# Patient Record
Sex: Male | Born: 1950 | Race: White | Hispanic: No | Marital: Married | Smoking: Current every day smoker
Health system: Southern US, Community
[De-identification: ages and names within clinical notes are randomized; demographics above are authoritative.]

## PROBLEM LIST (undated history)

## (undated) DIAGNOSIS — R634 Abnormal weight loss: Secondary | ICD-10-CM

## (undated) DIAGNOSIS — I2510 Atherosclerotic heart disease of native coronary artery without angina pectoris: Secondary | ICD-10-CM

## (undated) DIAGNOSIS — E785 Hyperlipidemia, unspecified: Secondary | ICD-10-CM

## (undated) DIAGNOSIS — K220 Achalasia of cardia: Secondary | ICD-10-CM

## (undated) DIAGNOSIS — I219 Acute myocardial infarction, unspecified: Secondary | ICD-10-CM

## (undated) DIAGNOSIS — C799 Secondary malignant neoplasm of unspecified site: Secondary | ICD-10-CM

## (undated) DIAGNOSIS — Z720 Tobacco use: Secondary | ICD-10-CM

## (undated) DIAGNOSIS — I10 Essential (primary) hypertension: Secondary | ICD-10-CM

## (undated) DIAGNOSIS — K219 Gastro-esophageal reflux disease without esophagitis: Secondary | ICD-10-CM

## (undated) HISTORY — DX: Hyperlipidemia, unspecified: E78.5

## (undated) HISTORY — PX: CORONARY ARTERY BYPASS GRAFT: SHX141

## (undated) HISTORY — DX: Essential (primary) hypertension: I10

## (undated) HISTORY — DX: Acute myocardial infarction, unspecified: I21.9

---

## 1997-12-08 ENCOUNTER — Ambulatory Visit (HOSPITAL_COMMUNITY): Admission: RE | Admit: 1997-12-08 | Discharge: 1997-12-08 | Payer: Self-pay | Admitting: Cardiology

## 1999-12-27 ENCOUNTER — Encounter: Payer: Self-pay | Admitting: Cardiology

## 1999-12-27 ENCOUNTER — Ambulatory Visit (HOSPITAL_COMMUNITY): Admission: RE | Admit: 1999-12-27 | Discharge: 1999-12-27 | Payer: Self-pay | Admitting: Cardiology

## 2001-04-11 DIAGNOSIS — I219 Acute myocardial infarction, unspecified: Secondary | ICD-10-CM

## 2001-04-11 HISTORY — DX: Acute myocardial infarction, unspecified: I21.9

## 2001-12-13 ENCOUNTER — Encounter: Payer: Self-pay | Admitting: Emergency Medicine

## 2001-12-13 ENCOUNTER — Inpatient Hospital Stay (HOSPITAL_COMMUNITY): Admission: EM | Admit: 2001-12-13 | Discharge: 2001-12-17 | Payer: Self-pay | Admitting: Emergency Medicine

## 2001-12-14 ENCOUNTER — Encounter: Payer: Self-pay | Admitting: Cardiology

## 2002-03-15 ENCOUNTER — Encounter: Payer: Self-pay | Admitting: Cardiothoracic Surgery

## 2002-03-15 ENCOUNTER — Encounter: Admission: RE | Admit: 2002-03-15 | Discharge: 2002-03-15 | Payer: Self-pay | Admitting: Cardiothoracic Surgery

## 2002-03-19 ENCOUNTER — Inpatient Hospital Stay (HOSPITAL_COMMUNITY): Admission: RE | Admit: 2002-03-19 | Discharge: 2002-03-23 | Payer: Self-pay | Admitting: Cardiothoracic Surgery

## 2002-03-19 ENCOUNTER — Encounter: Payer: Self-pay | Admitting: Cardiothoracic Surgery

## 2002-03-20 ENCOUNTER — Encounter: Payer: Self-pay | Admitting: Cardiothoracic Surgery

## 2002-03-21 ENCOUNTER — Encounter: Payer: Self-pay | Admitting: Cardiothoracic Surgery

## 2002-03-22 ENCOUNTER — Encounter: Payer: Self-pay | Admitting: Cardiothoracic Surgery

## 2002-04-12 ENCOUNTER — Encounter: Payer: Self-pay | Admitting: Cardiothoracic Surgery

## 2002-04-12 ENCOUNTER — Encounter: Admission: RE | Admit: 2002-04-12 | Discharge: 2002-04-12 | Payer: Self-pay | Admitting: Cardiothoracic Surgery

## 2005-08-10 ENCOUNTER — Encounter: Payer: Self-pay | Admitting: Cardiology

## 2010-04-11 DIAGNOSIS — E785 Hyperlipidemia, unspecified: Secondary | ICD-10-CM

## 2010-04-11 DIAGNOSIS — I10 Essential (primary) hypertension: Secondary | ICD-10-CM

## 2010-04-11 HISTORY — DX: Hyperlipidemia, unspecified: E78.5

## 2010-04-11 HISTORY — DX: Essential (primary) hypertension: I10

## 2010-06-11 ENCOUNTER — Emergency Department (HOSPITAL_COMMUNITY)
Admission: EM | Admit: 2010-06-11 | Discharge: 2010-06-11 | Discharge status: Against Medical Advice | Payer: Self-pay | Attending: Emergency Medicine | Admitting: Emergency Medicine

## 2010-06-11 DIAGNOSIS — I10 Essential (primary) hypertension: Secondary | ICD-10-CM | POA: Insufficient documentation

## 2010-06-11 DIAGNOSIS — R110 Nausea: Secondary | ICD-10-CM | POA: Insufficient documentation

## 2010-06-11 DIAGNOSIS — I252 Old myocardial infarction: Secondary | ICD-10-CM | POA: Insufficient documentation

## 2010-06-11 DIAGNOSIS — R197 Diarrhea, unspecified: Secondary | ICD-10-CM | POA: Insufficient documentation

## 2010-06-11 DIAGNOSIS — Z951 Presence of aortocoronary bypass graft: Secondary | ICD-10-CM | POA: Insufficient documentation

## 2010-06-11 DIAGNOSIS — F411 Generalized anxiety disorder: Secondary | ICD-10-CM | POA: Insufficient documentation

## 2010-06-11 DIAGNOSIS — I2510 Atherosclerotic heart disease of native coronary artery without angina pectoris: Secondary | ICD-10-CM | POA: Insufficient documentation

## 2010-06-11 LAB — BASIC METABOLIC PANEL
BUN: 14 mg/dL (ref 6–23)
CO2: 19 mEq/L (ref 19–32)
Calcium: 9.5 mg/dL (ref 8.4–10.5)
Chloride: 109 mEq/L (ref 96–112)
Creatinine, Ser: 1.12 mg/dL (ref 0.4–1.5)
GFR calc Af Amer: >60 mL/min (ref >60)
GFR calc non Af Amer: >60 mL/min (ref >60)
Glucose, Bld: 104 mg/dL — ABNORMAL HIGH (ref 70–99)
Potassium: 3.9 mEq/L (ref 3.5–5.1)
Sodium: 137 mEq/L (ref 135–145)

## 2010-06-11 LAB — CBC
HCT: 44.7 % (ref 39.0–52.0)
Hemoglobin: 16 g/dL (ref 13.0–17.0)
MCH: 31.9 pg (ref 26.0–34.0)
MCHC: 35.8 g/dL (ref 30.0–36.0)
MCV: 89 fL (ref 78.0–100.0)
Platelets: 218 10*3/uL (ref 150–400)
RBC: 5.02 MIL/uL (ref 4.22–5.81)
RDW: 13.9 % (ref 11.5–15.5)
WBC: 10.4 10*3/uL (ref 4.0–10.5)

## 2010-06-11 LAB — DIFFERENTIAL
Basophils Absolute: 0.1 10*3/uL (ref 0.0–0.1)
Basophils Relative: 1 % (ref 0–1)
Eosinophils Absolute: 0.1 10*3/uL (ref 0.0–0.7)
Eosinophils Relative: 1 % (ref 0–5)
Lymphocytes Relative: 26 % (ref 12–46)
Lymphs Abs: 2.7 10*3/uL (ref 0.7–4.0)
Monocytes Absolute: 1.2 10*3/uL — ABNORMAL HIGH (ref 0.1–1.0)
Monocytes Relative: 12 % (ref 3–12)
Neutro Abs: 6.3 10*3/uL (ref 1.7–7.7)
Neutrophils Relative %: 61 % (ref 43–77)

## 2012-02-28 ENCOUNTER — Encounter (HOSPITAL_COMMUNITY)
Admission: RE | Disposition: A | Discharge status: Home / Self Care | Payer: Self-pay | Source: Ambulatory Visit | Attending: Cardiology

## 2012-02-28 ENCOUNTER — Ambulatory Visit (HOSPITAL_COMMUNITY)
Admission: RE | Admit: 2012-02-28 | Discharge: 2012-02-28 | Disposition: A | Discharge status: Home / Self Care | Payer: PRIVATE HEALTH INSURANCE | Source: Ambulatory Visit | Attending: Cardiology | Admitting: Cardiology

## 2012-02-28 DIAGNOSIS — F17200 Nicotine dependence, unspecified, uncomplicated: Secondary | ICD-10-CM | POA: Insufficient documentation

## 2012-02-28 DIAGNOSIS — I252 Old myocardial infarction: Secondary | ICD-10-CM | POA: Insufficient documentation

## 2012-02-28 DIAGNOSIS — I2510 Atherosclerotic heart disease of native coronary artery without angina pectoris: Secondary | ICD-10-CM | POA: Insufficient documentation

## 2012-02-28 DIAGNOSIS — I10 Essential (primary) hypertension: Secondary | ICD-10-CM | POA: Insufficient documentation

## 2012-02-28 DIAGNOSIS — E7800 Pure hypercholesterolemia, unspecified: Secondary | ICD-10-CM | POA: Insufficient documentation

## 2012-02-28 DIAGNOSIS — I25810 Atherosclerosis of coronary artery bypass graft(s) without angina pectoris: Secondary | ICD-10-CM | POA: Insufficient documentation

## 2012-02-28 DIAGNOSIS — F329 Major depressive disorder, single episode, unspecified: Secondary | ICD-10-CM | POA: Insufficient documentation

## 2012-02-28 DIAGNOSIS — R7309 Other abnormal glucose: Secondary | ICD-10-CM | POA: Insufficient documentation

## 2012-02-28 DIAGNOSIS — F3289 Other specified depressive episodes: Secondary | ICD-10-CM | POA: Insufficient documentation

## 2012-02-28 HISTORY — PX: LEFT HEART CATHETERIZATION WITH CORONARY ANGIOGRAM: SHX5451

## 2012-02-28 SURGERY — LEFT HEART CATHETERIZATION WITH CORONARY ANGIOGRAM
Anesthesia: LOCAL

## 2012-02-28 MED ORDER — ONDANSETRON HCL 4 MG/2ML IJ SOLN: 4.0000 mg | Freq: Four times a day (QID) | INTRAMUSCULAR | Status: DC | PRN

## 2012-02-28 MED ORDER — ASPIRIN 81 MG PO CHEW
CHEWABLE_TABLET | ORAL | Status: AC
  Filled 2012-02-28: qty 4

## 2012-02-28 MED ORDER — SODIUM CHLORIDE 0.9 % IV SOLN: INTRAVENOUS | Status: DC

## 2012-02-28 MED ORDER — ACETAMINOPHEN 325 MG PO TABS: 650.0000 mg | ORAL_TABLET | ORAL | Status: DC | PRN

## 2012-02-28 MED ORDER — NITROGLYCERIN 0.2 MG/ML ON CALL CATH LAB
INTRAVENOUS | Status: AC
  Filled 2012-02-28: qty 1

## 2012-02-28 MED ORDER — SODIUM CHLORIDE 0.9 % IJ SOLN: 3.0000 mL | INTRAMUSCULAR | Status: DC | PRN

## 2012-02-28 MED ORDER — SODIUM CHLORIDE 0.9 % IV SOLN
INTRAVENOUS | Status: DC
  Administered 2012-02-28: 1000 mL via INTRAVENOUS

## 2012-02-28 MED ORDER — MIDAZOLAM HCL 2 MG/2ML IJ SOLN
INTRAMUSCULAR | Status: AC
  Filled 2012-02-28: qty 2

## 2012-02-28 MED ORDER — FENTANYL CITRATE 0.05 MG/ML IJ SOLN
INTRAMUSCULAR | Status: AC
  Filled 2012-02-28: qty 2

## 2012-02-28 MED ORDER — LIDOCAINE HCL (PF) 1 % IJ SOLN
INTRAMUSCULAR | Status: AC
  Filled 2012-02-28: qty 30

## 2012-02-28 MED ORDER — ASPIRIN 81 MG PO CHEW
324.0000 mg | CHEWABLE_TABLET | ORAL | Status: AC
  Administered 2012-02-28: 324 mg via ORAL

## 2012-02-28 MED ORDER — SODIUM CHLORIDE 0.9 % IV SOLN: 250.0000 mL | INTRAVENOUS | Status: DC | PRN

## 2012-02-28 MED ORDER — SODIUM CHLORIDE 0.9 % IJ SOLN: 3.0000 mL | Freq: Two times a day (BID) | INTRAMUSCULAR | Status: DC

## 2012-02-28 MED ORDER — OXYCODONE-ACETAMINOPHEN 5-325 MG PO TABS: 1.0000 | ORAL_TABLET | ORAL | Status: DC | PRN

## 2012-02-28 MED ORDER — HEPARIN (PORCINE) IN NACL 2-0.9 UNIT/ML-% IJ SOLN
INTRAMUSCULAR | Status: AC
  Filled 2012-02-28: qty 1000

## 2012-02-28 NOTE — Progress Notes (Signed)
UP AND WALKED AND TOL WELL RIGHT GROIN NO BLEEDING OR HEMATOMA 

## 2012-02-28 NOTE — H&P (Signed)
  Handwritten H&P in the chart needs to be scanned. 

## 2012-02-28 NOTE — Progress Notes (Signed)
DR HARWANI CALLED AND IV FLUIDS MAY BE D/CED AFTER 4HRS

## 2012-02-28 NOTE — CV Procedure (Signed)
Left cardiac cath report dictated on 02/28/2012 dictation number is [id number]

## 2012-02-29 NOTE — Cardiovascular Report (Signed)
NAMEFRANKY, REIER NO.:  [id number]  MEDICAL RECORD NO.:  [id number]  LOCATION:  [facility]                         FACILITY:  [facility]  PHYSICIAN:  Eduardo Osier. Sharyn Lull, M.D. DATE OF BIRTH:  [birth year]  DATE OF PROCEDURE:  02/28/2012 DATE OF DISCHARGE:  02/28/2012                           CARDIAC CATHETERIZATION   PROCEDURE:  Left cardiac cath with selective left and right coronary angiography, visualization of saphenous vein graft, LIMA graft, LV graphy via right groin using Judkins technique.  INDICATION FOR THE PROCEDURE:  Mr. Mukai is 61 year old male with past medical history significant for coronary artery disease, history of MI x2, status post CABG in the past.  He had LIMA to LAD, saphenous vein graft to diagonal 1, sequential saphenous vein graft to ramus and distal left circumflex and also saphenous vein graft to RCA, hypertension, hypercholesteremia, tobacco abuse, complains of retrosternal chest pain, squeezing in nature, grade 6/10 associated with feeling weak and tired and no energy.  Denies any nausea, vomiting, diaphoresis.  Denies palpitation, lightheadedness, or syncope.  Denies PND, orthopnea, or leg swelling.  Also complains of exertional dyspnea with minimal exertion. The patient continues to smoke 1 pack per day.  States chest pain feels similar in nature when he had MI.  PAST MEDICAL HISTORY:  As above.  PAST SURGICAL HISTORY:  He had CABG as above and had hemorrhoidectomy in the past.  SOCIAL HISTORY:  He is married.  Smoked 2 packs per day for 20+ years and now 1 pack per day.  No history of alcohol abuse.  FAMILY HISTORY:  Positive for coronary artery disease.  MEDICATIONS AT HOME:  He was on aspirin, Toprol, simvastatin, Paxil, Xanax and Plavix and Imdur were recently added.  PHYSICAL EXAMINATION:  GENERAL:  He was alert, awake, oriented x3, in no acute distress. VITAL SIGNS:  Blood pressure was 130/80, pulse was 68 and  regular. EYES:  Conjunctivae was pink. NECK:  Supple.  No JVD.  No bruit. LUNGS:  Clear to auscultation without rhonchi or rales. CARDIOVASCULAR:  S1, S2 was normal.  There was soft systolic murmur. ABDOMEN:  Soft.  Bowel sounds were present.  Nontender. EXTREMITIES:  There is no clubbing, cyanosis, or edema.  IMPRESSION:  Unstable angina, rule out myocardial infarction, coronary artery disease, history of myocardial infarction x2 in the past, status post coronary artery bypass graft, hypertension, glucose intolerance, hypercholesteremia, depression, tobacco abuse.  Discussed with the patient regarding various options of treatment, i.e., noninvasive stress testing versus left cath, possible PTCA stenting, its risks and benefits, i.e., death, MI, stroke, need for emergency CABG, local vascular complications, etc., and consented for PCI.  PROCEDURE:  After obtaining the informed consent, the patient was brought to the cath lab and was placed on fluoroscopy table.  Right groin was prepped and draped in usual fashion.  Xylocaine 1% was used for local anesthesia in the right groin.  With the help of thin wall needle, 6-French arterial sheath was placed.  The sheath was aspirated and flushed.  Next 6-French left Judkins catheter was advanced over the wire under fluoroscopic guidance up to the ascending aorta.  Wire was pulled out.  The catheter was aspirated and connected  to the Manifold. Catheter was further advanced and engaged into left coronary ostium. Multiple views of the left system were taken.  Next, the catheter was disengaged and was pulled out over the wire and was replaced with 6- [place] right Judkins catheter, which was advanced over the wire under fluoroscopic guidance up to the ascending aorta.  Wire was pulled out. The catheter was aspirated and connected to the Manifold.  Catheter was further advanced and engaged into saphenous vein graft to RCA.  Multiple views of this  graft were taken.  Next catheter was disengaged and was engaged into saphenous vein graft to sequential saphenous vein graft to ramus and distal circumflex.  Multiple views of this graft were taken. This catheter was disengaged and was engaged into saphenous vein graft to diagonal 1.  Multiple views of this graft were taken.  Next, the catheter was disengaged and was advanced under fluoroscopic guidance over the wire into left subclavian artery.  This catheter was exchanged over the wire with IM diagnostic catheter which was advanced over the wire under fluoroscopic guidance up to the subclavian artery.  This catheter was then subsequently engaged into LIMA to LAD.  Multiple views of this graft were taken.  Next, the catheter was disengaged and was pulled out over the wire and was replaced with 6-French pigtail catheter, which was advanced over the wire under fluoroscopic guidance up to the ascending aorta.  Catheter was further advanced across the aortic valve into the LV.  LV pressures were recorded.  Next LV graft was done in 30-degree RAO position.  Post angiographic pressures were recorded from LV and then pullback pressures were recorded from aorta. There was no gradient across the aortic valve.  Next the pigtail catheter was pulled out over the wire.  Sheaths were aspirated and flushed.  FINDINGS:  LV showed good LV systolic function, mild LVH, EF of approximately 50%.  Left main had 80-85% distal stenosis.  LAD was 100% occluded beyond proximal portion.  Diagonal 1 was very small.  Ramus has 60-70% proximal stenosis which was filling distally by saphenous vein graft.  Left circumflex has 60-80% sequential ostial and proximal stenosis and then was 100% occluded distally.  OM 1 was very small which was patent.  Native RCA was 100% occluded at the ostium from prior angiogram.  Saphenous vein graft to PDA has a wedge-shaped filling defect in the mid body of the graft with TIMI grade  3 distal flow.  This appears to be the valve.  Native RCA was filling distally from saphenous vein graft, which has 90% distal stenosis.  PLV branches were diffusely diseased.  Sequential saphenous vein graft to ramus and distal left circumflex, #1 distal left circumflex portion beyond the graft has 85- 95% stenosis and the distal left circumflex beyond the anastomosis vessel is very small.  Ramus portion of the saphenous vein graft branch beyond the anastomosis is patent.  Saphenous vein graft to diagonal 1 has 30-40% anastomotic stenosis with haziness but TIMI grade 3 distal flow.  LIMA to LAD is patent.  Native LAD distally is diffusely diseased.  The patient tolerated the procedure well.  There were no complications.  Plan is to maximize antianginal medications and treat medically.     Eduardo Osier. Sharyn Lull, M.D.     MNH/MEDQ  D:  02/28/2012  T:  02/29/2012  Job:  [id number]

## 2012-05-23 ENCOUNTER — Encounter (HOSPITAL_COMMUNITY): Payer: Self-pay | Admitting: Pharmacy Technician

## 2012-05-29 ENCOUNTER — Ambulatory Visit (HOSPITAL_COMMUNITY): Admission: RE | Admit: 2012-05-29 | Payer: PRIVATE HEALTH INSURANCE | Source: Ambulatory Visit | Admitting: Cardiology

## 2012-05-29 ENCOUNTER — Encounter (HOSPITAL_COMMUNITY): Admission: RE | Payer: Self-pay | Source: Ambulatory Visit

## 2012-05-29 SURGERY — PERCUTANEOUS CORONARY STENT INTERVENTION (PCI-S)
Anesthesia: LOCAL

## 2012-06-05 ENCOUNTER — Encounter (HOSPITAL_COMMUNITY)
Admission: RE | Discharge status: Against Medical Advice | Payer: Self-pay | Source: Ambulatory Visit | Attending: Cardiology

## 2012-06-05 ENCOUNTER — Ambulatory Visit (HOSPITAL_COMMUNITY)
Admission: RE | Admit: 2012-06-05 | Discharge: 2012-06-06 | Discharge status: Against Medical Advice | Payer: PRIVATE HEALTH INSURANCE | Source: Ambulatory Visit | Attending: Cardiology | Admitting: Cardiology

## 2012-06-05 DIAGNOSIS — I2582 Chronic total occlusion of coronary artery: Secondary | ICD-10-CM | POA: Insufficient documentation

## 2012-06-05 DIAGNOSIS — I200 Unstable angina: Secondary | ICD-10-CM | POA: Insufficient documentation

## 2012-06-05 DIAGNOSIS — E7800 Pure hypercholesterolemia, unspecified: Secondary | ICD-10-CM | POA: Insufficient documentation

## 2012-06-05 DIAGNOSIS — R7309 Other abnormal glucose: Secondary | ICD-10-CM | POA: Insufficient documentation

## 2012-06-05 DIAGNOSIS — Z8249 Family history of ischemic heart disease and other diseases of the circulatory system: Secondary | ICD-10-CM | POA: Insufficient documentation

## 2012-06-05 DIAGNOSIS — I25810 Atherosclerosis of coronary artery bypass graft(s) without angina pectoris: Secondary | ICD-10-CM | POA: Insufficient documentation

## 2012-06-05 DIAGNOSIS — I2510 Atherosclerotic heart disease of native coronary artery without angina pectoris: Secondary | ICD-10-CM | POA: Insufficient documentation

## 2012-06-05 DIAGNOSIS — I10 Essential (primary) hypertension: Secondary | ICD-10-CM | POA: Insufficient documentation

## 2012-06-05 DIAGNOSIS — I252 Old myocardial infarction: Secondary | ICD-10-CM | POA: Insufficient documentation

## 2012-06-05 DIAGNOSIS — F17200 Nicotine dependence, unspecified, uncomplicated: Secondary | ICD-10-CM | POA: Insufficient documentation

## 2012-06-05 HISTORY — PX: PERCUTANEOUS CORONARY STENT INTERVENTION (PCI-S): SHX5485

## 2012-06-05 SURGERY — PERCUTANEOUS CORONARY STENT INTERVENTION (PCI-S)
Anesthesia: LOCAL

## 2012-06-05 MED ORDER — ALPRAZOLAM 0.25 MG PO TABS: 0.2500 mg | ORAL_TABLET | Freq: Three times a day (TID) | ORAL | Status: DC | PRN

## 2012-06-05 MED ORDER — ASPIRIN 81 MG PO CHEW
324.0000 mg | CHEWABLE_TABLET | ORAL | Status: AC
  Administered 2012-06-05: 324 mg via ORAL

## 2012-06-05 MED ORDER — SODIUM CHLORIDE 0.9 % IJ SOLN: 3.0000 mL | Freq: Two times a day (BID) | INTRAMUSCULAR | Status: DC

## 2012-06-05 MED ORDER — MIDAZOLAM HCL 2 MG/2ML IJ SOLN: 2.0000 mg | Freq: Once | INTRAMUSCULAR | Status: DC

## 2012-06-05 MED ORDER — MIDAZOLAM HCL 2 MG/2ML IJ SOLN
INTRAMUSCULAR | Status: AC
  Filled 2012-06-05: qty 2

## 2012-06-05 MED ORDER — NITROGLYCERIN IN D5W 200-5 MCG/ML-% IV SOLN
5.0000 ug/min | INTRAVENOUS | Status: DC
  Filled 2012-06-05: qty 250

## 2012-06-05 MED ORDER — SODIUM CHLORIDE 0.9 % IJ SOLN: 3.0000 mL | INTRAMUSCULAR | Status: DC | PRN

## 2012-06-05 MED ORDER — METOPROLOL SUCCINATE ER 50 MG PO TB24
50.0000 mg | ORAL_TABLET | Freq: Every day | ORAL | Status: DC
  Filled 2012-06-05: qty 1

## 2012-06-05 MED ORDER — ALPRAZOLAM 0.25 MG PO TABS
0.5000 mg | ORAL_TABLET | Freq: Two times a day (BID) | ORAL | Status: DC
  Administered 2012-06-05: 0.5 mg via ORAL
  Filled 2012-06-05 (×2): qty 2

## 2012-06-05 MED ORDER — NITROGLYCERIN 1 MG/10 ML FOR IR/CATH LAB
INTRA_ARTERIAL | Status: AC
  Filled 2012-06-05: qty 10

## 2012-06-05 MED ORDER — MIDAZOLAM HCL 2 MG/2ML IJ SOLN
2.0000 mg | Freq: Once | INTRAMUSCULAR | Status: AC
  Administered 2012-06-05: 2 mg via INTRAVENOUS

## 2012-06-05 MED ORDER — BIVALIRUDIN 250 MG IV SOLR
INTRAVENOUS | Status: AC
  Filled 2012-06-05: qty 250

## 2012-06-05 MED ORDER — SODIUM CHLORIDE 0.9 % IV SOLN: 250.0000 mL | INTRAVENOUS | Status: DC | PRN

## 2012-06-05 MED ORDER — SODIUM CHLORIDE 0.9 % IV SOLN
INTRAVENOUS | Status: DC
  Administered 2012-06-05: 10:00:00 via INTRAVENOUS

## 2012-06-05 MED ORDER — SODIUM CHLORIDE 0.9 % IV SOLN
0.2500 mg/kg/h | INTRAVENOUS | Status: AC
  Filled 2012-06-05: qty 250

## 2012-06-05 MED ORDER — ASPIRIN 81 MG PO TABS: 81.0000 mg | ORAL_TABLET | Freq: Every day | ORAL | Status: DC

## 2012-06-05 MED ORDER — HYDRALAZINE HCL 20 MG/ML IJ SOLN
10.0000 mg | Freq: Once | INTRAMUSCULAR | Status: AC
  Administered 2012-06-05: 10 mg via INTRAVENOUS
  Filled 2012-06-05: qty 0.5

## 2012-06-05 MED ORDER — SODIUM CHLORIDE 0.9 % IV SOLN: INTRAVENOUS | Status: AC

## 2012-06-05 MED ORDER — HEPARIN (PORCINE) IN NACL 2-0.9 UNIT/ML-% IJ SOLN
INTRAMUSCULAR | Status: AC
  Filled 2012-06-05: qty 1000

## 2012-06-05 MED ORDER — MORPHINE SULFATE 2 MG/ML IJ SOLN
2.0000 mg | INTRAMUSCULAR | Status: DC | PRN
  Administered 2012-06-05: 17:00:00 2 mg via INTRAVENOUS
  Administered 2012-06-05: 16:00:00 4 mg via INTRAVENOUS
  Filled 2012-06-05: qty 1
  Filled 2012-06-05: qty 2

## 2012-06-05 MED ORDER — MIDAZOLAM HCL 2 MG/2ML IJ SOLN
2.0000 mg | Freq: Once | INTRAMUSCULAR | Status: AC
  Administered 2012-06-05: 17:00:00 2 mg via INTRAVENOUS
  Filled 2012-06-05: qty 2

## 2012-06-05 MED ORDER — ACETAMINOPHEN 325 MG PO TABS: 650.0000 mg | ORAL_TABLET | ORAL | Status: DC | PRN

## 2012-06-05 MED ORDER — TICAGRELOR 90 MG PO TABS
90.0000 mg | ORAL_TABLET | Freq: Two times a day (BID) | ORAL | Status: DC
  Administered 2012-06-05: 90 mg via ORAL
  Filled 2012-06-05 (×3): qty 1

## 2012-06-05 MED ORDER — LIDOCAINE HCL (PF) 1 % IJ SOLN
INTRAMUSCULAR | Status: AC
  Filled 2012-06-05: qty 30

## 2012-06-05 MED ORDER — ASPIRIN 81 MG PO CHEW
CHEWABLE_TABLET | ORAL | Status: AC
  Filled 2012-06-05: qty 4

## 2012-06-05 MED ORDER — NITROGLYCERIN 0.4 MG SL SUBL: 0.4000 mg | SUBLINGUAL_TABLET | SUBLINGUAL | Status: DC | PRN

## 2012-06-05 MED ORDER — TICAGRELOR 90 MG PO TABS
ORAL_TABLET | ORAL | Status: AC
  Filled 2012-06-05: qty 2

## 2012-06-05 MED ORDER — ONDANSETRON HCL 4 MG/2ML IJ SOLN: 4.0000 mg | Freq: Four times a day (QID) | INTRAMUSCULAR | Status: DC | PRN

## 2012-06-05 MED ORDER — HYDRALAZINE HCL 20 MG/ML IJ SOLN
10.0000 mg | Freq: Once | INTRAMUSCULAR | Status: AC
  Administered 2012-06-05: 17:00:00 10 mg via INTRAVENOUS
  Filled 2012-06-05: qty 0.5

## 2012-06-05 MED ORDER — FENTANYL CITRATE 0.05 MG/ML IJ SOLN
INTRAMUSCULAR | Status: AC
  Filled 2012-06-05: qty 2

## 2012-06-05 MED ORDER — DIAZEPAM 5 MG PO TABS
ORAL_TABLET | ORAL | Status: AC
  Filled 2012-06-05: qty 1

## 2012-06-05 MED ORDER — PAROXETINE HCL 20 MG PO TABS
20.0000 mg | ORAL_TABLET | Freq: Every day | ORAL | Status: DC
  Filled 2012-06-05: qty 1

## 2012-06-05 MED ORDER — ASPIRIN 81 MG PO CHEW
81.0000 mg | CHEWABLE_TABLET | Freq: Every day | ORAL | Status: DC
  Filled 2012-06-05: qty 1

## 2012-06-05 MED ORDER — SIMVASTATIN 40 MG PO TABS
40.0000 mg | ORAL_TABLET | Freq: Every day | ORAL | Status: DC
  Filled 2012-06-05: qty 1

## 2012-06-05 MED ORDER — SODIUM CHLORIDE 0.9 % IV SOLN
0.2500 mg/kg/h | INTRAVENOUS | Status: DC
  Filled 2012-06-05: qty 250

## 2012-06-05 MED ORDER — DIAZEPAM 5 MG PO TABS
5.0000 mg | ORAL_TABLET | ORAL | Status: AC
  Administered 2012-06-05: 5 mg via ORAL

## 2012-06-05 MED ORDER — PANTOPRAZOLE SODIUM 40 MG PO TBEC
40.0000 mg | DELAYED_RELEASE_TABLET | Freq: Every day | ORAL | Status: DC
  Administered 2012-06-06: 40 mg via ORAL
  Filled 2012-06-05 (×2): qty 1

## 2012-06-05 MED ORDER — ASPIRIN 81 MG PO CHEW: 81.0000 mg | CHEWABLE_TABLET | Freq: Every day | ORAL | Status: DC

## 2012-06-05 NOTE — Progress Notes (Signed)
Site area: right groin  Site Prior to Removal:  Level 0  Pressure Applied For 20 MINUTES    Minutes Beginning at 1708  Manual:   yes  Patient Status During Pull:  stable  Post Pull Groin Site:  Level 0  Post Pull Instructions Given:  yes  Post Pull Pulses Present:  yes  Dressing Applied:  yes  Comments:  Gauze pressure dressing applied

## 2012-06-05 NOTE — Progress Notes (Signed)
Patient seems very angry about being in the hospital.  States he wants to leave and wants staff to stop f-----ing with him.  Trying to meet his needs as best I can.  Explained why it is important that he had procedure and post procedure care; but he does not seem to accept explainaition.  Will attempt to give xanax and morphine to reduce anxiety, improve mood, and mitigate pain for sheath pull.

## 2012-06-05 NOTE — Progress Notes (Signed)
Patient continues with abusive language to staff, non compliant, refusing meds, tele monitoring, BP checks, threatening to leave AMA.  Dr. Sharyn Lull and [facility] called to bedside.

## 2012-06-05 NOTE — CV Procedure (Signed)
PTCA stenting report dictated on 06/05/2012 dictation number is [id number]

## 2012-06-05 NOTE — H&P (Signed)
  Handwritten H&P in the chart needs to be scanned. 

## 2012-06-05 NOTE — Progress Notes (Signed)
Utilization Review Completed Fedor Kazmierski J. Ripley Lovecchio, RN, BSN, NCM [phone]  

## 2012-06-05 NOTE — Progress Notes (Signed)
Patient continues to send staff out of the room and does not want to be bothered. I was able to assess the groin site level zero. I explained we would try to let him rest but I still needed to monitor him. Patient made it clear that he does not want anyone come in his room. I am unable to agree however I will make visits as needed or limited.

## 2012-06-06 LAB — BASIC METABOLIC PANEL
BUN: 8 mg/dL (ref 6–23)
Calcium: 9.2 mg/dL (ref 8.4–10.5)
GFR calc non Af Amer: >90 mL/min (ref >90)
Glucose, Bld: 114 mg/dL — ABNORMAL HIGH (ref 70–99)

## 2012-06-06 LAB — CBC
HCT: 41 % (ref 39.0–52.0)
Hemoglobin: 14.1 g/dL (ref 13.0–17.0)
MCH: 30.7 pg (ref 26.0–34.0)
MCHC: 34.4 g/dL (ref 30.0–36.0)

## 2012-06-06 MED ORDER — TICAGRELOR 90 MG PO TABS: 90.0000 mg | ORAL_TABLET | Freq: Two times a day (BID) | ORAL | Status: DC

## 2012-06-06 MED FILL — Dextrose Inj 5%: INTRAVENOUS | Qty: 50 | Status: AC

## 2012-06-06 NOTE — Discharge Instructions (Signed)
Coronary Angiography with Stent This is a procedure to widen or open a narrow blood vessel of the heart (coronary artery). When a coronary artery becomes partially blocked it decreases blood flow to that area. This may lead to chest pain or a heart attack (myocardial infarction). Arteries may become blocked by cholesterol buildup (plaque) in the lining or wall. A stent is a small piece of metal that looks like a mesh or a spring. Stent placement may be done right after an angiogram that finds a blocked artery or as a treatment for a heart attack. RISKS AND COMPLICATIONS  Damage to the heart.  A blockage may return.  Bleeding at the site.  Blood clot to another part of the body. PROCEDURE  You may be given a medication to help you relax before and during the procedure through an IV in your hand or arm.  A local anesthetic to make the area numb may be used before inserting the catheter (a long, hollow tube about the size of a piece of cooked spaghetti).  You will be prepared for the procedure by washing and shaving the area where the catheter will be inserted. This is usually done in the groin.  A specially trained doctor will insert the catheter with a guide wire into an artery. This is guided under a special type of X-ray (fluoroscopy) to the opening of the blocked artery.  Special dye is then injected and X-rays are taken.  A tiny wire is guided to the blocked spot and a balloon is inflated to make the artery wider. The stent is expanded and crushes the plaque into the wall of the vessel. The stent holds the area open like a scaffolding and improves the blood flow.  Sometimes the artery may be made wider using a laser or other tools to remove plaque.  When the blood flow is better, the catheter is removed. The lining of the artery will grow over the stent which stays where it was placed. AFTER THE PROCEDURE  You will stay in bed for several hours.  The access site will be watched  and you will be checked frequently.  Blood tests, X-rays and an EKG may be done.  You may stay in the hospital overnight for observation. SEEK IMMEDIATE MEDICAL CARE IF:   You develop chest pain, shortness of breath, feel faint, or pass out.  There is bleeding, swelling, or drainage from the catheter insertion site.  You develop pain, discoloration, coldness, or severe bruising in the leg or arm that held the catheter.  You see blood in your urine or stool. This may be bright red blood in urine or stools, or also appear as black, tarry stools.  You have a fever. Document Released: 10/02/2002 Document Revised: 06/20/2011 Document Reviewed: 05/25/2007 [facility] Patient Information 2013 [place], [place]. Acute Coronary Syndrome Acute coronary syndrome (ACS) is an urgent problem in which the blood and oxygen supply to the heart is critically deficient. ACS requires hospitalization because one or more coronary arteries may be blocked. ACS represents a range of conditions including:  Previous angina that is now unstable, lasts longer, happens at rest, or is more intense.  A heart attack, with heart muscle cell injury and death. There are three vital coronary arteries that supply the heart muscle with blood and oxygen so that it can pump blood effectively. If blockages to these arteries develop, blood flow to the heart muscle is reduced. If the heart does not get enough blood, angina may occur as  the first warning sign. SYMPTOMS   The most common signs of angina include:  Tightness or squeezing in the chest.  Feeling of heaviness on the chest.  Discomfort in the arms, neck, or jaw.  Shortness of breath and nausea.  Cold, wet skin.  Angina is usually brought on by physical effort or excitement which increase the oxygen needs of the heart. These states increase the blood flow needs of the heart beyond what can be delivered. TREATMENT   Medicines to help discomfort may include  nitroglycerin (nitro) in the form of tablets or a spray for rapid relief, or longer-acting forms such as cream, patches, or capsules. (Be aware that there are many side effects and possible interactions with other drugs).  Other medicines may be used to help the heart pump better.  Procedures to open blocked arteries including angioplasty or stent placement to keep the arteries open.  Open heart surgery may be needed when there are many blockages or they are in critical locations that are best treated with surgery. HOME CARE INSTRUCTIONS   Avoid smoking.  Take one baby or adult aspirin daily, if your caregiver advises. This helps reduce the risk of a heart attack.  It is very important that you follow the angina treatment prescribed by your caregiver. Make arrangements for proper follow-up care.  Eat a heart healthy diet with salt and fat restrictions as advised.  Regular exercise is good for you as long as it does not cause discomfort. Do not begin any new type of exercise until you check with your caregiver.  If you are overweight, you should lose weight.  Try to maintain normal blood lipid levels.  Keep your blood pressure under control as recommended by your caregiver.  You should tell your caregiver right away about any increase in the severity or frequency of your chest discomfort or angina attacks. When you have angina, you should stop what you are doing and sit down. This may bring relief in 3 to 5 minutes. If your caregiver has prescribed nitro, take it as directed.  If your caregiver has given you a follow-up appointment, it is very important to keep that appointment. Not keeping the appointment could result in a chronic or permanent injury, pain, and disability. If there is any problem keeping the appointment, you must call back to this facility for assistance. SEEK IMMEDIATE MEDICAL CARE IF:   You develop nausea, vomiting, or shortness of breath.  You feel faint,  lightheaded, or pass out.  Your chest discomfort gets worse.  You are sweating or experience sudden profound fatigue.  You do not get relief of your chest pain after 3 doses of nitro.  Your discomfort lasts longer than 15 minutes. MAKE SURE YOU:   Understand these instructions.  Will watch your condition.  Will get help right away if you are not doing well or get worse. Document Released: 03/28/2005 Document Revised: 06/20/2011 Document Reviewed: 10/30/2007 [facility] Patient Information 2013 [place], [place].

## 2012-06-06 NOTE — Discharge Summary (Signed)
Erik Blake, Erik Blake NO.:  [id number]  MEDICAL RECORD NO.:  [id number]  LOCATION:  6529                         FACILITY:  [facility]  PHYSICIAN:  Eduardo Osier. Sharyn Lull, M.D. DATE OF BIRTH:  May 28, 1950  DATE OF ADMISSION:  06/05/2012 DATE OF DISCHARGE:  06/06/2012                              DISCHARGE SUMMARY   ADMITTING DIAGNOSES: 1. Unstable angina. 2. Coronary artery disease status post coronary artery bypass graft in     the past 3. History of myocardial infarction x3 in the past. 4. Hypertension. 5. Glucose intolerance. 6. Hypercholesteremia. 7. Positive family history of coronary artery disease. 8. Tobacco abuse.  DISCHARGE DIAGNOSES: 1. Stable angina status post left cath/percutaneous transluminal     coronary angioplasty stenting to protected left main and left     circumflex, coronary artery disease. 2. Status post coronary artery bypass graft. 3. History of myocardial infarction x3 in the past. 4. Hypertension. 5. Glucose intolerance. 6. Hypercholesteremia. 7. Positive family history of coronary artery disease. 8. Tobacco abuse.  DISCHARGE HOME MEDICATIONS: 1. Enteric-coated aspirin 81 mg 1 tablet daily. 2. Brilinta 90 mg 1 tablet twice daily. 3. Xanax 0.5 mg twice daily. 4. Amlodipine 5 mg daily. 5. Metoprolol succinate 50 mg daily. 6. Nitrostat 0.4 mg use as directed. 7. Paxil 20 mg 1 tablet daily. 8. Simvastatin 40 mg daily.  DIET:  Low-salt, low-cholesterol.  ACTIVITY:  Increase activity slowly as tolerated.  The patient will be discussed to join phase 2 cardiac rehab as outpatient.  Patient signed out against medical advice, post cath, PTCA stents instruction were given.  BRIEF HISTORY AND HOSPITAL COURSE:  [name] is a 62 year old white male with past medical history significant for coronary artery disease, history of MI in x 2 in 1990s, and then subsequently had non-Q-wave myocardial infarction in 2003, requiring emergency PTCA  stenting to left circumflex.  Noted to have 3-vessel disease, subsequently required CABG in December 2003, had LIMA to LAD, saphenous vein graft to diagonal, sequential saphenous vein graft to ramus and left circumflex, saphenous vein graft to RCA, hypertension, glucose intolerance, hypercholesteremia, tobacco abuse, complains of retrosternal chest pain, squeezing in nature, associated with feeling weak and tired with no energy.  Denies any nausea, vomiting, diaphoresis.  Also complains of exertional dyspnea with minimal exertion, and gives out by the end of the day.  Denies any palpitation, lightheadedness, syncope.  The patient had left cath in November 2013, which showed critical stenosis and sequential saphenous vein graft to ramus and left circumflex.  Ramus graft appears to be patent, but left circumflex portion of the graft at critical stenosis with haziness and was noted to have 60-80% ostial and proximal left circumflex stenosis prior to OM1 and also critical distal left main stenosis.  The patient initially opted for medical management, but due to recurrent chest pain, consented for high-risk PCI.  PAST MEDICAL HISTORY:  As above.  PAST SURGICAL HISTORY:  He had CABG as above, had hemorrhoidectomy in the past.  SOCIAL HISTORY:  He is married, smoked 2 packs per day for 20 plus years and now smokes less than 1 pack per day, and is in the process of quitting.  No  history of alcohol abuse.  FAMILY HISTORY:  Positive for coronary artery disease.  Home medications were aspirin, Plavix, Toprol, Imdur, simvastatin, Norvasc, Paxil, Xanax, and Nitrostat.  PHYSICAL EXAMINATION:  GENERAL:  He was alert, awake, oriented x3, in no acute distress. VITAL SIGNS:  Blood pressure was 130/80, pulse was 68. EYES:  Conjunctivae was pink. NECK:  Supple.  No JVD.  No bruit. LUNGS:  Clear to auscultation without rhonchi or rales. CARDIOVASCULAR:  S1, S2 was normal.  There was soft systolic  murmur.  No S3 gallop. ABDOMEN:  Soft.  Bowel sounds were present.  Nontender. EXTREMITIES:  There is no clubbing, cyanosis, or edema.  LABORATORY DATA:  His sodium was 139, potassium 3.8, BUN 8, creatinine 0.82, glucose was 114.  Hemoglobin was 14.1, hematocrit 41, white count of 8.2.  Postprocedure EKG, this morning showed normal sinus rhythm with poor R-wave progression and nonspecific ST-T wave changes.  BRIEF HOSPITAL COURSE:  The patient was a.m. admit and underwent left cardiac cath and PTCA stenting to protected left main and left circumflex.  The patient tolerated the procedure well.  There were no complications.  Postprocedure, the patient did not have any chest pains during the hospital stay.  The patient was very adamant to leave the hospital against medical advice yesterday, but finally agreed to stay till this morning.  The patient got up early this morning, and pulled out all his IVs and signed out against medical advice.  We will contact the patient at home and has pharmacy regarding his medications and the patient will be followed up in my office later this week.     Eduardo Osier. Sharyn Lull, M.D.     [facility]/MEDQ  D:  06/06/2012  T:  06/06/2012  Job:  [id number]

## 2012-06-06 NOTE — Progress Notes (Signed)
Patient refuses to wear monitor this morning. He wants to leave spoke with Dr.Harwani he will be here in 10 min. Patient refuses to stay he took his own IV out and  signed AMA explanation that insurance may not cover admission was also given. Wife at bedside unable to keep patient here also. [employer] coverage was also notified.

## 2012-06-06 NOTE — Discharge Summary (Signed)
  Discharge summary dictated on 06/06/2012 dictation number is [id number]

## 2012-06-06 NOTE — Cardiovascular Report (Signed)
NAMEKERRIGAN, Erik Blake NO.:  [id number]  MEDICAL RECORD NO.:  [id number]  LOCATION:  6529                         FACILITY:  [facility]  PHYSICIAN:  Eduardo Osier. Sharyn Lull, M.D. DATE OF BIRTH:  04-11-1951  DATE OF PROCEDURE:  06/05/2012 DATE OF DISCHARGE:                           CARDIAC CATHETERIZATION   PROCEDURES: 1. Left cardiac cath with selective left coronary angiography,     visualization of saphenous vein graft via right groin using Judkins     technique. 2. Successful percutaneous transluminal coronary angioplasty to     proximal and ostial left circumflex using 2.5 x 12-mm long Emerge     balloon. 3. Successful deployment of 3.0 x 18-mm long Xience expedition drug-     eluting stent in proximal and ostial left circumflex. 4. Successful postdilatation of the stent using 3.25 x 15-mm long [place]     Quantum apex balloon going up to 15 atmospheric pressure. 5. Successful percutaneous transluminal coronary angioplasty to mid     and distal left main using initially 2.5 x 12-mm long Emerge     balloon and then 3.25 x 15-mm long [place] Quantum apex balloon. 6. Successful deployment of 4.0 x 12-mm long Xience expedition drug-     eluting stent in distal and mid left main overlapping with ostial     left circumflex stent, successful postdilatation of the stent using     4.0 x 8-mm long [place] Quantum apex balloon.  INDICATION FOR THE PROCEDURE:  Erik Blake is a 62 year old white male with past medical history significant for coronary artery disease, history of MI x2 in 50s.  He then had non-Q-wave myocardial infarction in 2003, requiring emergency PCI to left circumflex.  Noted to have 3- vessel coronary artery disease, subsequently required CABG in December, 2003.  He had LIMA to LAD, saphenous vein graft to diagonal 1, sequential saphenous vein graft to ramus and obtuse marginal 1, saphenous vein graft to RCA, hypertension, glucose intolerance, hypercholesteremia, and  depression, tobacco abuse, complains of retrosternal chest pain, squeezing in nature associated with feeling weak, tired, and no energy.  Denies any nausea, vomiting, diaphoresis. Also complains of exertional dyspnea with minimal exertion, and gives out by the end of the day.  The patient denies any palpitation, lightheadedness, or syncope.  The patient had left cath in November 2013, which showed critical stenosis in sequential graft to saphenous vein graft to ramus and left circumflex.  Ramus graft appears to be patent, but the left circumflex graft had critical stenosis with haziness and thrombus.  Native left circumflex was noted to have 60-80% ostial and proximal stenosis prior to OM1.  Left main had 80-85% distal stenosis.  The patient initially opted for medical Rx, but due to recurrent chest pain, consented for high-risk PCI.  PROCEDURE:  After obtaining the informed consent, the patient was brought to the cath lab and was placed on fluoroscopy table.  Right groin was prepped and draped in usual fashion.  Xylocaine 1% was used for local anesthesia in the right groin.  With the help of thin wall needle, 6-French arterial sheath was placed.  The sheath was aspirated and flushed.  Next, 5-French left bypass diagnostic catheter  was advanced over the wire under fluoroscopic guidance up to the ascending aorta.  Wire was pulled out.  The catheter was aspirated and connected to the Manifold.  Catheter was further advanced and engaged into saphenous vein graft to diagonal 1.  The single view of this graft was obtained.  Next, the catheter was disengaged and was engaged into sequential saphenous vein graft to ramus and left circumflex.  A single view of this graft was obtained.  Next, the catheter was disengaged and was pulled out over the wire and was replaced with 6-French 3.5 [place] guiding catheter was advanced over the wire under fluoroscopic guidance up to the ascending aorta.  Wire  was pulled out.  The catheter was aspirated and connected to the Manifold.  Catheter was further advanced and engaged into left coronary ostium.  Multiple views of the left system were taken.  FINDINGS:  Left main had 80-85% distal stenosis.  LAD was 100% occluded as before.  Ramus has 40-50% proximal stenosis, which was filling saphenous vein graft.  Left circumflex has 60-85% sequential ostial and proximal stenosis prior to OM1.  OM1 has 40-50% proximal stenosis, left circumflex has 40-50% mid stenosis and then was 100% occluded.  The patient also has large left atrial branch, which was patent.  Saphenous vein graft to diagonal 1 was patent, had anastomotic stenosis 20-30% as before, but with good TIMI grade 3 distal flow.  Sequential saphenous vein graft to ramus and OM1.  Ramus portion of the graft was patent with TIMI grade 3 distal flow.  OM1 portion of the sequential graft has haziness with thrombus with TIMI 1 distal flow in the native OM1.  INTERVENTIONAL PROCEDURE:  Successful PTCA to proximal and ostial left circumflex was done using 2.5 x 12-mm long emergent balloon for predilatation and then 3.0 x 18-mm long Xience expedition drug-eluting stent was deployed at 11 atmospheric pressure.  The stent was post dilated using 3.25 x 15-mm long [place] Quantum apex balloon going up to 15 atmospheric pressure.  Lesion dilated from 60-85% to 0% residual with excellent TIMI grade 3 distal flow without evidence of dissection or distal embolization, and then PTCA to distal and mid left main was done using same 3.25 x 15-mm long [place] Quantum apex balloon for predilatation, and then 4.0 x 12-mm long Xience expedition drug-eluting stent was deployed at 11 atmospheric pressure in mid and distal left main going to overlapping with left circumflex ostial stent.  The stent was post dilated using 4.0 x 8-mm long [place] Quantum apex balloon going up to 12 atmospheric pressure in the left circumflex and  15-18 atmospheric pressure in the left main lesion dilated from 80-85% to 0% residual with excellent TIMI grade 3 distal flow without evidence of dissection or distal embolization, or any compromise of flow in ramus or LAD system. The patient received weight-based Angiomax and 180 mg of Brilinta prior to the procedure.  The patient tolerated the procedure well.  There were no complications.  The patient was transferred to recovery room in stable condition.     Eduardo Osier. Sharyn Lull, M.D.     [facility]  D:  06/05/2012  T:  06/06/2012  Job:  [id number]

## 2012-10-09 DIAGNOSIS — Z0279 Encounter for issue of other medical certificate: Secondary | ICD-10-CM

## 2012-10-30 DIAGNOSIS — Z0271 Encounter for disability determination: Secondary | ICD-10-CM

## 2013-12-19 ENCOUNTER — Ambulatory Visit: Payer: PRIVATE HEALTH INSURANCE

## 2013-12-23 ENCOUNTER — Encounter: Payer: Self-pay | Admitting: Family Medicine

## 2013-12-23 ENCOUNTER — Ambulatory Visit: Payer: Self-pay | Attending: Family Medicine | Admitting: Family Medicine

## 2013-12-23 VITALS — BP 103/69 | HR 100 | Temp 99.1°F | Resp 18 | Ht 73.0 in | Wt 197.0 lb

## 2013-12-23 DIAGNOSIS — K220 Achalasia of cardia: Secondary | ICD-10-CM | POA: Insufficient documentation

## 2013-12-23 DIAGNOSIS — F17200 Nicotine dependence, unspecified, uncomplicated: Secondary | ICD-10-CM | POA: Insufficient documentation

## 2013-12-23 DIAGNOSIS — I10 Essential (primary) hypertension: Secondary | ICD-10-CM

## 2013-12-23 DIAGNOSIS — R634 Abnormal weight loss: Secondary | ICD-10-CM | POA: Insufficient documentation

## 2013-12-23 DIAGNOSIS — M25569 Pain in unspecified knee: Secondary | ICD-10-CM | POA: Insufficient documentation

## 2013-12-23 DIAGNOSIS — Z6826 Body mass index (BMI) 26.0-26.9, adult: Secondary | ICD-10-CM | POA: Insufficient documentation

## 2013-12-23 DIAGNOSIS — M25561 Pain in right knee: Secondary | ICD-10-CM | POA: Insufficient documentation

## 2013-12-23 DIAGNOSIS — M25562 Pain in left knee: Secondary | ICD-10-CM

## 2013-12-23 DIAGNOSIS — R1310 Dysphagia, unspecified: Secondary | ICD-10-CM | POA: Insufficient documentation

## 2013-12-23 LAB — COMPLETE METABOLIC PANEL WITH GFR
ALBUMIN: 4.1 g/dL (ref 3.5–5.2)
ALK PHOS: 118 U/L — AB (ref 39–117)
ALT: 17 U/L (ref 0–53)
AST: 16 U/L (ref 0–37)
BUN: 10 mg/dL (ref 6–23)
CALCIUM: 9 mg/dL (ref 8.4–10.5)
CHLORIDE: 97 meq/L (ref 96–112)
CO2: 27 mEq/L (ref 19–32)
Creat: 0.97 mg/dL (ref 0.50–1.35)
GFR, Est African American: >89 mL/min
GFR, Est Non African American: 83 mL/min
Glucose, Bld: 92 mg/dL (ref 70–99)
POTASSIUM: 4.2 meq/L (ref 3.5–5.3)
SODIUM: 132 meq/L — AB (ref 135–145)
TOTAL PROTEIN: 7.2 g/dL (ref 6.0–8.3)
Total Bilirubin: 0.6 mg/dL (ref 0.2–1.2)

## 2013-12-23 LAB — CBC WITH DIFFERENTIAL/PLATELET
BASOS ABS: 0 10*3/uL (ref 0.0–0.1)
BASOS PCT: 0 % (ref 0–1)
Eosinophils Absolute: 0.1 10*3/uL (ref 0.0–0.7)
Eosinophils Relative: 1 % (ref 0–5)
HEMATOCRIT: 35.1 % — AB (ref 39.0–52.0)
HEMOGLOBIN: 12.2 g/dL — AB (ref 13.0–17.0)
LYMPHS PCT: 23 % (ref 12–46)
Lymphs Abs: 1.9 10*3/uL (ref 0.7–4.0)
MCH: 28.2 pg (ref 26.0–34.0)
MCHC: 34.8 g/dL (ref 30.0–36.0)
MCV: 81.1 fL (ref 78.0–100.0)
MONOS PCT: 11 % (ref 3–12)
Monocytes Absolute: 0.9 10*3/uL (ref 0.1–1.0)
NEUTROS ABS: 5.4 10*3/uL (ref 1.7–7.7)
NEUTROS PCT: 65 % (ref 43–77)
Platelets: 308 10*3/uL (ref 150–400)
RBC: 4.33 MIL/uL (ref 4.22–5.81)
RDW: 15.1 % (ref 11.5–15.5)
WBC: 8.3 10*3/uL (ref 4.0–10.5)

## 2013-12-23 MED ORDER — METHYLPREDNISOLONE ACETATE 40 MG/ML IJ SUSP: 40.0000 mg | Freq: Once | INTRAMUSCULAR | Status: DC

## 2013-12-23 NOTE — Assessment & Plan Note (Signed)
A: knee pain bilateral. Normal exam. Suspect OA.  P:  R knee corticosteroid injection today if he tolerates it will do L knee in 7-10 days.

## 2013-12-23 NOTE — Progress Notes (Signed)
   Subjective:    Patient ID: Erik Blake, male    DOB: [birth year], 63 y.o.   MRN: [id number] CC: establish care, trouble swallowing, pain in legs  HPI  1. Trouble swallowing: occasional since 2004. Worsening over the past 9 months. Trouble swallowing solid and dry foods (biscuits).  Associated with increased saliva production. No trouble swallowing wet/moist foods. No trouble swallowing liquids. No pain with swallowing. Has limited dietary choices. Eating 2 meals per day. No longer goes out to eat. Has loss weight. No fever, chills or night sweats.   2. B/l knee pain: last year. No swelling in joint or redness. Pain is soreness like a toothache after prolonged sitting. No recent injury.   Soc hx: current smoker  Review of Systems As per HPI     Objective:   Physical Exam BP 103/69  Pulse 100  Temp(Src) 99.1 F (37.3 C) (Oral)  Resp 18  Ht 6\' 1"  (1.854 m)  Wt 197 lb (89.359 kg)  BMI 26.00 kg/m2  SpO2 99% Wt Readings from Last 3 Encounters:  12/23/13 197 lb (89.359 kg)  06/05/12 235 lb (106.595 kg)  06/05/12 235 lb (106.595 kg)  General appearance: alert, cooperative and no distress Throat: lips, mucosa, and tongue normal; teeth and gums normal Neck: no adenopathy, supple, symmetrical, trachea midline and thyroid not enlarged, symmetric, no tenderness/mass/nodules Lungs: clear to auscultation bilaterally Heart: regular rate and rhythm, S1, S2 normal, no murmur, click, rub or gallop Knee: Full ROM. No deformity. No patellar tenderness. Joint line tenderness both knees. R medial and lateral. L medial.   After obtaining informed consent and cleaning the skin using iodine and alcohol a  steroid injection was performed at R knee using 1% plain Lidocaine and 40 mg/ml of Depo Medrol. This was well tolerated     Assessment & Plan:

## 2013-12-23 NOTE — Patient Instructions (Addendum)
Erik Blake,  Thank you for coming in today. It was a pleasure meeting you. I look forward to be a primary doctor.   1. For knee pain: suspect arthritis.  Increase activity with recumbent bike or walking. R knee injection done today if have symptom improvement, plan for L knee injection.  Ice the area.  Keep knee elevated.  Call if you have swelling, pain, redness.   2. For swallowing. Your problem appears to be at the level of the esophagus. I've ordered a barium swallow study to evaluate esophageal function. If this is abnormal the next that is an upper endoscopy.  F/u in 7-10 days  Dr. Adrian Blackwater

## 2013-12-23 NOTE — Progress Notes (Signed)
Establish Care Complaining on knee pain, hard to stand up and walk Stated has throat discomfort, building to much saliva

## 2013-12-23 NOTE — Assessment & Plan Note (Addendum)
A: history consistent with esophageal dysmotility. Exam concerning due to weight loss P:  I ordered a barium swallow study to evaluate esophageal function. If this is abnormal the next that is an upper endoscopy.

## 2013-12-24 ENCOUNTER — Telehealth: Payer: Self-pay | Admitting: *Deleted

## 2013-12-24 NOTE — Telephone Encounter (Signed)
Pt aware of Apt Barium swallow on Thursday 17 at 09:00 Pt advice to be NPO after midnight

## 2013-12-24 NOTE — Telephone Encounter (Signed)
Message copied by Betti Cruz on Tue Dec 24, 2013  5:44 PM ------      Message from: Boykin Nearing      Created: Tue Dec 24, 2013  5:17 PM       Slight drop in Hgb, plan for hemoccult at f/u visit.      Slight drop in sodium, plan to repeat, may need to stop paxil if  Trending down due to risk of low sodium on SSRI ------

## 2013-12-24 NOTE — Telephone Encounter (Signed)
Pt aware of lab results 

## 2013-12-26 ENCOUNTER — Ambulatory Visit (HOSPITAL_COMMUNITY): Payer: PRIVATE HEALTH INSURANCE

## 2013-12-30 ENCOUNTER — Encounter: Payer: Self-pay | Admitting: Gastroenterology

## 2013-12-30 ENCOUNTER — Ambulatory Visit (HOSPITAL_COMMUNITY): Payer: PRIVATE HEALTH INSURANCE

## 2013-12-30 ENCOUNTER — Ambulatory Visit (HOSPITAL_COMMUNITY)
Admission: RE | Admit: 2013-12-30 | Discharge: 2013-12-30 | Disposition: A | Discharge status: Home / Self Care | Payer: PRIVATE HEALTH INSURANCE | Source: Ambulatory Visit | Attending: Family Medicine | Admitting: Family Medicine

## 2013-12-30 ENCOUNTER — Telehealth: Payer: Self-pay | Admitting: Family Medicine

## 2013-12-30 DIAGNOSIS — K229 Disease of esophagus, unspecified: Secondary | ICD-10-CM | POA: Insufficient documentation

## 2013-12-30 DIAGNOSIS — R634 Abnormal weight loss: Secondary | ICD-10-CM | POA: Insufficient documentation

## 2013-12-30 DIAGNOSIS — R1310 Dysphagia, unspecified: Secondary | ICD-10-CM | POA: Insufficient documentation

## 2013-12-30 NOTE — Telephone Encounter (Signed)
Pt's ex-wife called stating that pt. Received a message from Dr. About results Pt's ex-wife states that pt. Is hard of hearing and did not understand the voicemail. Please f/u with pt.

## 2013-12-30 NOTE — Telephone Encounter (Signed)
Called patient left voice mail regarding the results of barium swallow fall plan which includes referral to GI for endoscopy. Based on barium swallow patient has achalasia. There is no evidence of malignancy a barium swallow. I did inform the patient that followup EGD is necessary to fully rule out malignancy and fully diagnose and treat achalasia. Asked  patient to return called to discuss findings in more detail.

## 2013-12-31 ENCOUNTER — Telehealth: Payer: Self-pay | Admitting: Emergency Medicine

## 2013-12-31 NOTE — Telephone Encounter (Signed)
Left message for pt to call for test results

## 2013-12-31 NOTE — Telephone Encounter (Signed)
Left message for pt to call when message received

## 2014-01-02 ENCOUNTER — Ambulatory Visit: Payer: PRIVATE HEALTH INSURANCE | Admitting: Family Medicine

## 2014-01-08 ENCOUNTER — Ambulatory Visit: Payer: Self-pay | Attending: Family Medicine | Admitting: Family Medicine

## 2014-01-08 ENCOUNTER — Encounter: Payer: Self-pay | Admitting: Family Medicine

## 2014-01-08 VITALS — BP 109/74 | HR 97 | Temp 98.3°F | Resp 18 | Ht 68.0 in | Wt 193.0 lb

## 2014-01-08 DIAGNOSIS — R634 Abnormal weight loss: Secondary | ICD-10-CM | POA: Insufficient documentation

## 2014-01-08 DIAGNOSIS — M25562 Pain in left knee: Secondary | ICD-10-CM

## 2014-01-08 DIAGNOSIS — Z23 Encounter for immunization: Secondary | ICD-10-CM

## 2014-01-08 DIAGNOSIS — Z299 Encounter for prophylactic measures, unspecified: Secondary | ICD-10-CM

## 2014-01-08 DIAGNOSIS — I2510 Atherosclerotic heart disease of native coronary artery without angina pectoris: Secondary | ICD-10-CM | POA: Insufficient documentation

## 2014-01-08 DIAGNOSIS — I10 Essential (primary) hypertension: Secondary | ICD-10-CM | POA: Insufficient documentation

## 2014-01-08 DIAGNOSIS — M25561 Pain in right knee: Secondary | ICD-10-CM

## 2014-01-08 DIAGNOSIS — Z7982 Long term (current) use of aspirin: Secondary | ICD-10-CM | POA: Insufficient documentation

## 2014-01-08 DIAGNOSIS — M25569 Pain in unspecified knee: Secondary | ICD-10-CM | POA: Insufficient documentation

## 2014-01-08 DIAGNOSIS — F17200 Nicotine dependence, unspecified, uncomplicated: Secondary | ICD-10-CM | POA: Insufficient documentation

## 2014-01-08 DIAGNOSIS — R1310 Dysphagia, unspecified: Secondary | ICD-10-CM | POA: Insufficient documentation

## 2014-01-08 DIAGNOSIS — Z6829 Body mass index (BMI) 29.0-29.9, adult: Secondary | ICD-10-CM | POA: Insufficient documentation

## 2014-01-08 NOTE — Progress Notes (Signed)
F/U knee pain

## 2014-01-08 NOTE — Patient Instructions (Signed)
Mr. Scheidt,  Thank you for coming back to see me today.   Regarding her blood pressure like you to stop Norvasc. I will discuss this with Dr. Terrence Dupont    Regarding trouble swallowing please keep your followup appointment with her gastroenterologist.  Smoking cessation support: smoking cessation hotline: 1-800-QUIT-NOW.  Smoking cessation classes are available through [facility]. Call [phone] or visit our website at [email or web address]  F/u with me  in 10 weeks   Dr. Adrian Blackwater

## 2014-01-08 NOTE — Assessment & Plan Note (Signed)
A: longterm problem with weight loss. Barium swallow concerning for Achalasia P: GI appt for EGD and treatment scheduled

## 2014-01-08 NOTE — Assessment & Plan Note (Signed)
A: chronic OA pain. Stable. Patient declines L knee corticosteroid injection today P: Increase physical activity as tolerated

## 2014-01-08 NOTE — Progress Notes (Signed)
   Subjective:    Patient ID: Erik Blake, male    DOB: 09/22/1950, 63 y.o.   MRN: [id number] CC: f/u chronic knee pain, f/u trouble swallowing  HPI 63 year old male smoker presents for follow visit discussed the following:  #1 chronic knee pain: Patient reports slight improvement in pain symptoms after right knee cortical steroid injection but does not feel like the injection helped much. Patient has been walking but is limited by fatigue. Patient denies joint swelling.  #2 trouble swallowing: Since last visit patient went for a barium swallow which revealed evidence of achalasia. Patient's or symptoms remain unchanged. Patient's diet has remain unchanged. Patient does have called appointment with gastroenterology on 03/03/2014.  #3 hypertension: Patient with history of hypertension coronary artery disease. Patient's compliant with Norvasc and metoprolol. He endorses presyncopal symptoms. He had one syncopal episode over a year ago. He denies chest pain. He does admit to stable dyspnea on exertion.  Soc Hx: current smoker 1-1.5 PPD  Review of Systems As per HPI     Objective:   Physical Exam BP 109/74  Pulse 97  Temp(Src) 98.3 F (36.8 C) (Oral)  Resp 18  Ht 5\' 8"  (1.727 m)  Wt 193 lb (87.544 kg)  BMI 29.35 kg/m2  SpO2 100% BP Readings from Last 3 Encounters:  01/08/14 109/74  12/23/13 103/69  06/06/12 149/103   Wt Readings from Last 3 Encounters:  01/08/14 193 lb (87.544 kg)  12/23/13 197 lb (89.359 kg)  06/05/12 235 lb (106.595 kg)  General appearance: alert, cooperative and no distress, thin adult male  Extremities: extremities normal, atraumatic, no cyanosis or edema Lungs: clear to auscultation bilaterally Heart: regular rate and rhythm, S1, S2 normal, no murmur, click, rub or gallop       Assessment & Plan:

## 2014-01-08 NOTE — Assessment & Plan Note (Signed)
A: BP over treated and patient symptomatic. I recommend stopping norvasc. Patient would prefer to meet with his cardiologist before discontinuing this medication P: Continue BB D/c norvasc if patient's cardiologist agrees. Will fax note to patient's cardiologist

## 2014-01-08 NOTE — Assessment & Plan Note (Signed)
A: patient knows he needs to quit. He  Is not ready. He is not amenable to retrying wellbutrin. He does not want chantix. P: counseling provided  Resources provided

## 2014-01-08 NOTE — Addendum Note (Signed)
Addended by: Betti Cruz on: 01/08/2014 11:17 AM   Modules accepted: Orders

## 2014-01-16 ENCOUNTER — Ambulatory Visit: Payer: PRIVATE HEALTH INSURANCE

## 2014-02-05 ENCOUNTER — Ambulatory Visit: Payer: Self-pay

## 2014-03-03 ENCOUNTER — Ambulatory Visit: Payer: Self-pay | Admitting: Gastroenterology

## 2014-03-20 ENCOUNTER — Encounter (HOSPITAL_COMMUNITY): Payer: Self-pay | Admitting: Cardiology

## 2014-05-12 DIAGNOSIS — R634 Abnormal weight loss: Secondary | ICD-10-CM

## 2014-05-12 HISTORY — DX: Abnormal weight loss: R63.4

## 2014-05-19 ENCOUNTER — Emergency Department (HOSPITAL_COMMUNITY): Payer: Medicaid Other

## 2014-05-19 ENCOUNTER — Ambulatory Visit: Payer: Self-pay

## 2014-05-19 ENCOUNTER — Encounter (HOSPITAL_COMMUNITY): Payer: Self-pay | Admitting: Emergency Medicine

## 2014-05-19 ENCOUNTER — Inpatient Hospital Stay (HOSPITAL_COMMUNITY)
Admission: EM | Admit: 2014-05-19 | Discharge: 2014-05-24 | DRG: 166 | Discharge status: Against Medical Advice | Payer: Medicaid Other | Attending: Cardiology | Admitting: Cardiology

## 2014-05-19 DIAGNOSIS — I10 Essential (primary) hypertension: Secondary | ICD-10-CM | POA: Diagnosis present

## 2014-05-19 DIAGNOSIS — K229 Disease of esophagus, unspecified: Secondary | ICD-10-CM | POA: Diagnosis present

## 2014-05-19 DIAGNOSIS — J9600 Acute respiratory failure, unspecified whether with hypoxia or hypercapnia: Secondary | ICD-10-CM | POA: Diagnosis present

## 2014-05-19 DIAGNOSIS — Z6825 Body mass index (BMI) 25.0-25.9, adult: Secondary | ICD-10-CM

## 2014-05-19 DIAGNOSIS — Z8249 Family history of ischemic heart disease and other diseases of the circulatory system: Secondary | ICD-10-CM

## 2014-05-19 DIAGNOSIS — M170 Bilateral primary osteoarthritis of knee: Secondary | ICD-10-CM | POA: Diagnosis present

## 2014-05-19 DIAGNOSIS — I4891 Unspecified atrial fibrillation: Secondary | ICD-10-CM | POA: Diagnosis present

## 2014-05-19 DIAGNOSIS — R627 Adult failure to thrive: Secondary | ICD-10-CM | POA: Diagnosis present

## 2014-05-19 DIAGNOSIS — C7800 Secondary malignant neoplasm of unspecified lung: Secondary | ICD-10-CM | POA: Diagnosis present

## 2014-05-19 DIAGNOSIS — R64 Cachexia: Secondary | ICD-10-CM | POA: Diagnosis present

## 2014-05-19 DIAGNOSIS — I2510 Atherosclerotic heart disease of native coronary artery without angina pectoris: Secondary | ICD-10-CM | POA: Diagnosis present

## 2014-05-19 DIAGNOSIS — C799 Secondary malignant neoplasm of unspecified site: Secondary | ICD-10-CM

## 2014-05-19 DIAGNOSIS — I252 Old myocardial infarction: Secondary | ICD-10-CM

## 2014-05-19 DIAGNOSIS — K219 Gastro-esophageal reflux disease without esophagitis: Secondary | ICD-10-CM | POA: Diagnosis present

## 2014-05-19 DIAGNOSIS — Z833 Family history of diabetes mellitus: Secondary | ICD-10-CM

## 2014-05-19 DIAGNOSIS — Z888 Allergy status to other drugs, medicaments and biological substances status: Secondary | ICD-10-CM | POA: Diagnosis not present

## 2014-05-19 DIAGNOSIS — Z955 Presence of coronary angioplasty implant and graft: Secondary | ICD-10-CM | POA: Diagnosis not present

## 2014-05-19 DIAGNOSIS — C7931 Secondary malignant neoplasm of brain: Secondary | ICD-10-CM | POA: Diagnosis present

## 2014-05-19 DIAGNOSIS — R1310 Dysphagia, unspecified: Secondary | ICD-10-CM | POA: Diagnosis present

## 2014-05-19 DIAGNOSIS — J189 Pneumonia, unspecified organism: Principal | ICD-10-CM | POA: Diagnosis present

## 2014-05-19 DIAGNOSIS — Z951 Presence of aortocoronary bypass graft: Secondary | ICD-10-CM | POA: Diagnosis not present

## 2014-05-19 DIAGNOSIS — R0602 Shortness of breath: Secondary | ICD-10-CM

## 2014-05-19 DIAGNOSIS — E785 Hyperlipidemia, unspecified: Secondary | ICD-10-CM | POA: Diagnosis present

## 2014-05-19 DIAGNOSIS — Z9889 Other specified postprocedural states: Secondary | ICD-10-CM | POA: Insufficient documentation

## 2014-05-19 DIAGNOSIS — E43 Unspecified severe protein-calorie malnutrition: Secondary | ICD-10-CM | POA: Diagnosis present

## 2014-05-19 DIAGNOSIS — C3432 Malignant neoplasm of lower lobe, left bronchus or lung: Secondary | ICD-10-CM | POA: Diagnosis present

## 2014-05-19 DIAGNOSIS — F17210 Nicotine dependence, cigarettes, uncomplicated: Secondary | ICD-10-CM | POA: Diagnosis present

## 2014-05-19 DIAGNOSIS — F329 Major depressive disorder, single episode, unspecified: Secondary | ICD-10-CM | POA: Diagnosis present

## 2014-05-19 DIAGNOSIS — E780 Pure hypercholesterolemia: Secondary | ICD-10-CM | POA: Diagnosis present

## 2014-05-19 DIAGNOSIS — J449 Chronic obstructive pulmonary disease, unspecified: Secondary | ICD-10-CM | POA: Diagnosis present

## 2014-05-19 DIAGNOSIS — K220 Achalasia of cardia: Secondary | ICD-10-CM | POA: Diagnosis present

## 2014-05-19 HISTORY — DX: Tobacco use: Z72.0

## 2014-05-19 HISTORY — DX: Achalasia of cardia: K22.0

## 2014-05-19 HISTORY — DX: Atherosclerotic heart disease of native coronary artery without angina pectoris: I25.10

## 2014-05-19 HISTORY — DX: Gastro-esophageal reflux disease without esophagitis: K21.9

## 2014-05-19 HISTORY — DX: Abnormal weight loss: R63.4

## 2014-05-19 HISTORY — DX: Secondary malignant neoplasm of unspecified site: C79.9

## 2014-05-19 LAB — CBC WITH DIFFERENTIAL/PLATELET
BASOS ABS: 0.1 10*3/uL (ref 0.0–0.1)
BASOS PCT: 1 % (ref 0–1)
EOS ABS: 0.3 10*3/uL (ref 0.0–0.7)
Eosinophils Relative: 2 % (ref 0–5)
HEMATOCRIT: 50.2 % (ref 39.0–52.0)
HEMOGLOBIN: 17.6 g/dL — AB (ref 13.0–17.0)
LYMPHS ABS: 1 10*3/uL (ref 0.7–4.0)
Lymphocytes Relative: 8 % — ABNORMAL LOW (ref 12–46)
MCH: 30.2 pg (ref 26.0–34.0)
MCHC: 35.1 g/dL (ref 30.0–36.0)
MCV: 86.3 fL (ref 78.0–100.0)
MONO ABS: 1.2 10*3/uL — AB (ref 0.1–1.0)
MONOS PCT: 9 % (ref 3–12)
Neutro Abs: 10.4 10*3/uL — ABNORMAL HIGH (ref 1.7–7.7)
Neutrophils Relative %: 80 % — ABNORMAL HIGH (ref 43–77)
Platelets: 203 10*3/uL (ref 150–400)
RBC: 5.82 MIL/uL — ABNORMAL HIGH (ref 4.22–5.81)
RDW: 15.8 % — AB (ref 11.5–15.5)
WBC: 13 10*3/uL — ABNORMAL HIGH (ref 4.0–10.5)

## 2014-05-19 LAB — COMPREHENSIVE METABOLIC PANEL
ALT: 131 U/L — ABNORMAL HIGH (ref 0–53)
ANION GAP: 11 (ref 5–15)
AST: 172 U/L — AB (ref 0–37)
Albumin: 3.1 g/dL — ABNORMAL LOW (ref 3.5–5.2)
Alkaline Phosphatase: 671 U/L — ABNORMAL HIGH (ref 39–117)
BUN: 20 mg/dL (ref 6–23)
CALCIUM: 9.8 mg/dL (ref 8.4–10.5)
CO2: 27 mmol/L (ref 19–32)
Chloride: 92 mmol/L — ABNORMAL LOW (ref 96–112)
Creatinine, Ser: 1.27 mg/dL (ref 0.50–1.35)
GFR calc non Af Amer: 59 mL/min — ABNORMAL LOW (ref >90)
GFR, EST AFRICAN AMERICAN: 68 mL/min — AB (ref >90)
Glucose, Bld: 145 mg/dL — ABNORMAL HIGH (ref 70–99)
POTASSIUM: 3.7 mmol/L (ref 3.5–5.1)
SODIUM: 130 mmol/L — AB (ref 135–145)
TOTAL PROTEIN: 7.5 g/dL (ref 6.0–8.3)
Total Bilirubin: 2.4 mg/dL — ABNORMAL HIGH (ref 0.3–1.2)

## 2014-05-19 LAB — TROPONIN I

## 2014-05-19 LAB — I-STAT TROPONIN, ED: TROPONIN I, POC: 0 ng/mL (ref 0.00–0.08)

## 2014-05-19 LAB — BRAIN NATRIURETIC PEPTIDE: B NATRIURETIC PEPTIDE 5: 127.9 pg/mL — AB (ref 0.0–100.0)

## 2014-05-19 LAB — I-STAT CG4 LACTIC ACID, ED: Lactic Acid, Venous: 4.2 mmol/L (ref 0.5–2.0)

## 2014-05-19 MED ORDER — DEXTROSE 5 % IV SOLN
5.0000 mg/h | INTRAVENOUS | Status: DC
  Administered 2014-05-19: 5 mg/h via INTRAVENOUS
  Administered 2014-05-20: 7.5 mg/h via INTRAVENOUS
  Filled 2014-05-19: qty 100

## 2014-05-19 MED ORDER — ALPRAZOLAM 0.5 MG PO TABS
0.5000 mg | ORAL_TABLET | Freq: Two times a day (BID) | ORAL | Status: DC
  Administered 2014-05-20 – 2014-05-23 (×8): 0.5 mg via ORAL
  Filled 2014-05-19 (×8): qty 1

## 2014-05-19 MED ORDER — NITROGLYCERIN 0.4 MG SL SUBL: 0.4000 mg | SUBLINGUAL_TABLET | SUBLINGUAL | Status: DC | PRN

## 2014-05-19 MED ORDER — SODIUM CHLORIDE 0.9 % IV BOLUS (SEPSIS)
1000.0000 mL | Freq: Once | INTRAVENOUS | Status: AC
  Administered 2014-05-19: 1000 mL via INTRAVENOUS

## 2014-05-19 MED ORDER — ASPIRIN 81 MG PO CHEW: 324.0000 mg | CHEWABLE_TABLET | ORAL | Status: DC

## 2014-05-19 MED ORDER — AMIODARONE LOAD VIA INFUSION: 150.0000 mg | Freq: Once | INTRAVENOUS | Status: DC

## 2014-05-19 MED ORDER — SODIUM CHLORIDE 0.9 % IV SOLN
INTRAVENOUS | Status: DC
  Administered 2014-05-20 – 2014-05-22 (×4): via INTRAVENOUS

## 2014-05-19 MED ORDER — AMIODARONE IV BOLUS ONLY 150 MG/100ML
150.0000 mg | Freq: Once | INTRAVENOUS | Status: AC
Start: 2014-05-20 — End: 2014-05-20
  Administered 2014-05-20: 150 mg via INTRAVENOUS
  Filled 2014-05-19: qty 100

## 2014-05-19 MED ORDER — DEXTROSE 5 % IV SOLN
500.0000 mg | INTRAVENOUS | Status: DC
  Administered 2014-05-20 – 2014-05-21 (×3): 500 mg via INTRAVENOUS
  Filled 2014-05-19 (×4): qty 500

## 2014-05-19 MED ORDER — IOHEXOL 350 MG/ML SOLN
100.0000 mL | Freq: Once | INTRAVENOUS | Status: AC | PRN
  Administered 2014-05-19: 100 mL via INTRAVENOUS

## 2014-05-19 MED ORDER — HEPARIN BOLUS VIA INFUSION
4000.0000 [IU] | Freq: Once | INTRAVENOUS | Status: AC
  Administered 2014-05-19: 4000 [IU] via INTRAVENOUS
  Filled 2014-05-19: qty 4000

## 2014-05-19 MED ORDER — DILTIAZEM LOAD VIA INFUSION
10.0000 mg | Freq: Once | INTRAVENOUS | Status: AC
  Administered 2014-05-19: 10 mg via INTRAVENOUS
  Filled 2014-05-19: qty 10

## 2014-05-19 MED ORDER — ASPIRIN EC 81 MG PO TBEC: 81.0000 mg | DELAYED_RELEASE_TABLET | Freq: Every day | ORAL | Status: DC

## 2014-05-19 MED ORDER — PANTOPRAZOLE SODIUM 40 MG IV SOLR
40.0000 mg | INTRAVENOUS | Status: DC
  Administered 2014-05-20 – 2014-05-23 (×4): 40 mg via INTRAVENOUS
  Filled 2014-05-19 (×5): qty 40

## 2014-05-19 MED ORDER — ACETAMINOPHEN 325 MG PO TABS: 650.0000 mg | ORAL_TABLET | ORAL | Status: DC | PRN

## 2014-05-19 MED ORDER — ASPIRIN 300 MG RE SUPP: 300.0000 mg | RECTAL | Status: DC

## 2014-05-19 MED ORDER — DEXTROSE 5 % IV SOLN
1.0000 g | Freq: Once | INTRAVENOUS | Status: AC
  Administered 2014-05-19: 1 g via INTRAVENOUS
  Filled 2014-05-19: qty 10

## 2014-05-19 MED ORDER — CEFTRIAXONE SODIUM IN DEXTROSE 20 MG/ML IV SOLN
1.0000 g | INTRAVENOUS | Status: DC
  Administered 2014-05-20 – 2014-05-21 (×2): 1 g via INTRAVENOUS
  Filled 2014-05-19 (×3): qty 50

## 2014-05-19 MED ORDER — NITROGLYCERIN 0.4 MG SL SUBL
0.4000 mg | SUBLINGUAL_TABLET | SUBLINGUAL | Status: DC | PRN
Start: 2014-05-19 — End: 2014-05-19

## 2014-05-19 MED ORDER — PAROXETINE HCL 20 MG PO TABS
20.0000 mg | ORAL_TABLET | Freq: Every day | ORAL | Status: DC
  Administered 2014-05-20 – 2014-05-23 (×4): 20 mg via ORAL
  Filled 2014-05-19 (×5): qty 1

## 2014-05-19 MED ORDER — DEXTROSE 5 % IV SOLN: 500.0000 mg | Freq: Once | INTRAVENOUS | Status: DC

## 2014-05-19 MED ORDER — ASPIRIN 81 MG PO CHEW
324.0000 mg | CHEWABLE_TABLET | Freq: Once | ORAL | Status: AC
  Administered 2014-05-19: 324 mg via ORAL
  Filled 2014-05-19: qty 4

## 2014-05-19 MED ORDER — ONDANSETRON HCL 4 MG/2ML IJ SOLN
4.0000 mg | Freq: Four times a day (QID) | INTRAMUSCULAR | Status: DC | PRN
Start: 2014-05-19 — End: 2014-05-24
  Filled 2014-05-19: qty 2

## 2014-05-19 MED ORDER — ASPIRIN EC 81 MG PO TBEC
81.0000 mg | DELAYED_RELEASE_TABLET | Freq: Every day | ORAL | Status: DC
  Administered 2014-05-20 – 2014-05-23 (×4): 81 mg via ORAL
  Filled 2014-05-19 (×4): qty 1

## 2014-05-19 MED ORDER — METOPROLOL TARTRATE 25 MG PO TABS
25.0000 mg | ORAL_TABLET | Freq: Two times a day (BID) | ORAL | Status: DC
  Administered 2014-05-20 – 2014-05-23 (×9): 25 mg via ORAL
  Filled 2014-05-19 (×10): qty 1

## 2014-05-19 MED ORDER — HEPARIN (PORCINE) IN NACL 100-0.45 UNIT/ML-% IJ SOLN
1450.0000 [IU]/h | INTRAMUSCULAR | Status: AC
  Administered 2014-05-19: 1200 [IU]/h via INTRAVENOUS
  Filled 2014-05-19 (×2): qty 250

## 2014-05-19 NOTE — ED Provider Notes (Signed)
CSN: [id number]     Arrival date & time 05/19/14  1921 History   First MD Initiated Contact with Patient 05/19/14 1938     Chief Complaint  Patient presents with  . Shortness of Breath     (Consider location/radiation/quality/duration/timing/severity/associated sxs/prior Treatment) HPI Comments: Patient with 2 week history of shortness of breath that progressively worsened today. States cough productive of clear mucus. Denies chest pain. Previous history of bypass and multiple stents. He denies any fever. Found to be in atrial fibrillation with RVR. Denies history of the same. He endorses lightheadedness and dizziness. No syncope. No abdominal pain, focal weakness or tingling.  The history is provided by the patient and a relative. The history is limited by the condition of the patient.    Past Medical History  Diagnosis Date  . Hypertension 2012  . Myocardial infarction 2003  . Hyperlipidemia 2012   Past Surgical History  Procedure Laterality Date  . Coronary artery bypass graft  2003, 2015   . Left heart catheterization with coronary angiogram N/A 02/28/2012    Procedure: LEFT HEART CATHETERIZATION WITH CORONARY ANGIOGRAM;  Surgeon: Clent Demark, MD;  Location: [facility] CATH LAB;  Service: Cardiovascular;  Laterality: N/A;  . Percutaneous coronary stent intervention (pci-s) N/A 06/05/2012    Procedure: PERCUTANEOUS CORONARY STENT INTERVENTION (PCI-S);  Surgeon: Clent Demark, MD;  Location: [facility] CATH LAB;  Service: Cardiovascular;  Laterality: N/A;   Family History  Problem Relation Age of Onset  . Diabetes Mother   . Heart disease Mother   . Heart disease Father   . Diabetes Father   . Diabetes Sister     4 sisters   . Diabetes Brother   . Cancer Neg Hx    History  Substance Use Topics  . Smoking status: Current Every Day Smoker  . Smokeless tobacco: Never Used  . Alcohol Use: No    Review of Systems  Constitutional: Positive for activity change, appetite change and  fatigue. Negative for fever.  Respiratory: Positive for cough and shortness of breath. Negative for chest tightness.   Cardiovascular: Negative for chest pain.  Gastrointestinal: Negative for nausea, vomiting and abdominal pain.  Genitourinary: Negative for dysuria and hematuria.  Musculoskeletal: Negative for myalgias and arthralgias.  Skin: Negative for wound.  Neurological: Positive for dizziness, weakness and light-headedness. Negative for headaches.  A complete 10 system review of systems was obtained and all systems are negative except as noted in the HPI and PMH.      Allergies  Isosorbide  Home Medications   Prior to Admission medications   Medication Sig Start Date End Date Taking? Authorizing Provider  ALPRAZolam Duanne Moron) 0.5 MG tablet Take 0.5 mg by mouth 2 (two) times daily.   Yes Historical Provider, MD  amLODipine (NORVASC) 5 MG tablet Take 5 mg by mouth daily.   Yes Historical Provider, MD  aspirin 81 MG tablet Take 81 mg by mouth daily.   Yes Historical Provider, MD  clopidogrel (PLAVIX) 75 MG tablet Take 75 mg by mouth daily.   Yes Historical Provider, MD  metoprolol succinate (TOPROL-XL) 50 MG 24 hr tablet Take 50 mg by mouth daily. Take with or immediately following a meal.   Yes Historical Provider, MD  nitroGLYCERIN (NITROSTAT) 0.4 MG SL tablet Place 0.4 mg under the tongue every 5 (five) minutes as needed (for chest pain).    Yes Historical Provider, MD  PARoxetine (PAXIL) 20 MG tablet Take 20 mg by mouth daily.   Yes Historical  Provider, MD  simvastatin (ZOCOR) 40 MG tablet Take 40 mg by mouth every morning.    Yes Historical Provider, MD   BP 107/64 mmHg  Pulse 92  Temp(Src) 97.7 F (36.5 C) (Oral)  Resp 15  Ht 5\' 8"  (1.727 m)  Wt 170 lb 6.7 oz (77.3 kg)  BMI 25.92 kg/m2  SpO2 97% Physical Exam  Constitutional: He is oriented to person, place, and time. He appears well-developed and well-nourished. No distress.  HENT:  Head: Normocephalic and  atraumatic.  Mouth/Throat: Oropharynx is clear and moist. No oropharyngeal exudate.  Eyes: Conjunctivae and EOM are normal. Pupils are equal, round, and reactive to light.  Neck: Normal range of motion. Neck supple.  No meningismus.  Cardiovascular: Normal rate, normal heart sounds and intact distal pulses.   No murmur heard. Irregular rhythm, tachycardia  Pulmonary/Chest: Effort normal. No respiratory distress. He has wheezes.  Abdominal: Soft. There is no tenderness. There is no rebound and no guarding.  Musculoskeletal: Normal range of motion. He exhibits no edema or tenderness.  Neurological: He is alert and oriented to person, place, and time. No cranial nerve deficit. He exhibits normal muscle tone. Coordination normal.  No ataxia on finger to nose bilaterally. No pronator drift. 5/5 strength throughout. CN 2-12 intact. Negative Romberg. Equal grip strength. Sensation intact. Gait is normal.   Skin: Skin is warm.  Psychiatric: He has a normal mood and affect. His behavior is normal.  Nursing note and vitals reviewed.   ED Course  Procedures (including critical care time) Labs Review Labs Reviewed  CBC WITH DIFFERENTIAL/PLATELET - Abnormal; Notable for the following:    WBC 13.0 (*)    RBC 5.82 (*)    Hemoglobin 17.6 (*)    RDW 15.8 (*)    Neutrophils Relative % 80 (*)    Lymphocytes Relative 8 (*)    Neutro Abs 10.4 (*)    Monocytes Absolute 1.2 (*)    All other components within normal limits  COMPREHENSIVE METABOLIC PANEL - Abnormal; Notable for the following:    Sodium 130 (*)    Chloride 92 (*)    Glucose, Bld 145 (*)    Albumin 3.1 (*)    AST 172 (*)    ALT 131 (*)    Alkaline Phosphatase 671 (*)    Total Bilirubin 2.4 (*)    GFR calc non Af Amer 59 (*)    GFR calc Af Amer 68 (*)    All other components within normal limits  BRAIN NATRIURETIC PEPTIDE - Abnormal; Notable for the following:    B Natriuretic Peptide 127.9 (*)    All other components within normal  limits  COMPREHENSIVE METABOLIC PANEL - Abnormal; Notable for the following:    Sodium 130 (*)    Glucose, Bld 105 (*)    Albumin 2.8 (*)    AST 156 (*)    ALT 113 (*)    Alkaline Phosphatase 574 (*)    Total Bilirubin 3.1 (*)    GFR calc non Af Amer 89 (*)    All other components within normal limits  CBC WITH DIFFERENTIAL/PLATELET - Abnormal; Notable for the following:    WBC 11.6 (*)    RDW 15.7 (*)    All other components within normal limits  APTT - Abnormal; Notable for the following:    aPTT 73 (*)    All other components within normal limits  I-STAT CG4 LACTIC ACID, ED - Abnormal; Notable for the following:  Lactic Acid, Venous 4.20 (*)    All other components within normal limits  CULTURE, EXPECTORATED SPUTUM-ASSESSMENT  CULTURE, BLOOD (ROUTINE X 2)  CULTURE, BLOOD (ROUTINE X 2)  GRAM STAIN  TROPONIN I  HEPARIN LEVEL (UNFRACTIONATED)  CBC  HIV ANTIBODY (ROUTINE TESTING)  LEGIONELLA ANTIGEN, URINE  STREP PNEUMONIAE URINARY ANTIGEN  PROTIME-INR  BASIC METABOLIC PANEL  LIPID PANEL  I-STAT TROPOININ, ED  I-STAT CG4 LACTIC ACID, ED    Imaging Review Ct Angio Chest Pe W/cm &/or Wo Cm  05/19/2014   CLINICAL DATA:  Shortness of Breath  EXAM: CT ANGIOGRAPHY CHEST WITH CONTRAST  TECHNIQUE: Multidetector CT imaging of the chest was performed using the standard protocol during bolus administration of intravenous contrast. Multiplanar CT image reconstructions and MIPs were obtained to evaluate the vascular anatomy.  CONTRAST:  180mL OMNIPAQUE IOHEXOL 350 MG/ML SOLN  COMPARISON:  Chest radiograph May 19, 2014  FINDINGS: There is no demonstrable pulmonary embolus. There is no appreciable thoracic aortic aneurysm.  There are innumerable small pulmonary nodular lesions throughout the lungs consistent with metastatic foci. Nodular lesions range in size from as small as 2 mm to as large as 1.8 x 1.7 cm on the right and in the left mid lung measuring 1.6 x 1.3 cm. There is  consolidation throughout much of the left lower lobe.  There is an enlarged lymph node in the aortopulmonary window region measuring 2.0 x 1.6 cm. There is adenopathy to the left of the trachea measuring 2.8 by 1.5 cm. There is adenopathy anterior to the carina measuring 1.9 x 1.7 cm. There is sub- carinal adenopathy measuring 3.4 x 1.8 cm. There is probable adenopathy in the left hilum which is indistinguishable from the adjacent airspace consolidation. There may well be an endobronchial mass in the left lower lobe as well.  There is fluid in wall thickening throughout the esophagus from the level of the aortic arch to the gastroesophageal junction. Proximal to this area, there is distention esophagus with air.  Pericardium is not thickened. Patient is status post coronary artery bypass grafting.  In the visualized upper abdomen, mild ascites is appreciable. There is a nodular lesion in the posterior segment of the right lobe of the liver measuring 1.7 x 1.5 cm.  There are no blastic or lytic bone lesions. There is slight anterior wedging of 2 mid thoracic vertebral bodies. Thyroid appears unremarkable.  Review of the MIP images confirms the above findings.  IMPRESSION: No demonstrable pulmonary embolus.  Innumerable pulmonary nodular lesions throughout the lungs consistent with widespread metastatic disease. There also foci of adenopathy. There is left lower lobe consolidation. There may be an endobronchial lesion obstructing the left lower lobe bronchus.  Generalized esophageal dilatation with fluid extending from the level of the aortic arch to the gastroesophageal junction. This finding could indicate a distal esophageal mass. Direct visualization may be advisable given this appearance.  Small nodular lesion in the right lobe of liver, concerning for metastatic focus given other findings.   Electronically Signed   By: Lowella Grip M.D.   On: 05/19/2014 21:42   Dg Chest Portable 1 View  05/19/2014    CLINICAL DATA:  Cough and shortness of breath for 3 weeks  EXAM: PORTABLE CHEST - 1 VIEW  COMPARISON:  None.  FINDINGS: There is underlying emphysema. There is airspace consolidation in the left lower lobe.  There is a 1.8 x 1.5 cm nodular lesion in the right mid lung.  Heart size is normal. Pulmonary vascularity  reflects underlying emphysema. Patient is status post coronary artery bypass grafting. No adenopathy.  IMPRESSION: 1.8 x 1.5 cm nodular lesion right mid lung. Advise noncontrast enhanced chest CT to further evaluate.  Left lower lobe consolidation.  Underlying emphysema.   Electronically Signed   By: Lowella Grip M.D.   On: 05/19/2014 20:14     EKG Interpretation   Date/Time:  Monday May 19 2014 19:26:47 EST Ventricular Rate:  175 PR Interval:    QRS Duration: 100 QT Interval:  270 QTC Calculation: 460 R Axis:   -90 Text Interpretation:  Atrial fibrillation with rapid ventricular response  Anterolateral infarct , age undetermined Abnormal ECG new atrial  fibrillation Confirmed by Wyvonnia Dusky  MD, Saidi Santacroce [phone]) on 05/19/2014 7:50:19  PM      MDM   Final diagnoses:  SOB (shortness of breath)   SOB, palpitations with cough x 3 weeks. No chest pain.  EKG with new atrial fibrillation with RVR.    CXR with lung nodule and LLL consolidation.  Treat for CAP. IV cardizem gtt. Lactate 4, WBC 13. IVF given. No fever in the ED. Elevated LFTs and bilirubin.    HR controlled with IV cardizem.  CT shows no PE but evidence of metastatic disease, probable LLL obstruction lesion.  Possible esophageal and liver lesions.  dw Dr. Terrence Dupont who will admit.  CRITICAL CARE Performed by: Ezequiel Essex Total critical care time: 30 Critical care time was exclusive of separately billable procedures and treating other patients. Critical care was necessary to treat or prevent imminent or life-threatening deterioration. Critical care was time spent personally by me on the following  activities: development of treatment plan with patient and/or surrogate as well as nursing, discussions with consultants, evaluation of patient's response to treatment, examination of patient, obtaining history from patient or surrogate, ordering and performing treatments and interventions, ordering and review of laboratory studies, ordering and review of radiographic studies, pulse oximetry and re-evaluation of patient's condition.    Ezequiel Essex, MD 05/20/14 [phone]

## 2014-05-19 NOTE — ED Notes (Signed)
Patient transported to CT 

## 2014-05-19 NOTE — ED Notes (Addendum)
[phone]

## 2014-05-19 NOTE — ED Notes (Signed)
Pt presents with ongoing shortness of breath for the past 3 weeks that has gotten worse today, admits to having a cough for the past 3 weeks as well.  Denies CP, admits to cardiac hx.  Admits to recent weight loss as well.

## 2014-05-19 NOTE — ED Notes (Signed)
Plan of care reviewed with patient. Cardiology at bedside speaking with patient regarding CT scan.

## 2014-05-19 NOTE — Progress Notes (Signed)
ANTICOAGULATION CONSULT NOTE - Initial Consult  Pharmacy Consult for heparin Indication: atrial fibrillation  Allergies  Allergen Reactions  . Isosorbide Swelling    Patient Measurements: Weight: 178 lb (80.74 kg)  IBW: 68kg Heparin Dosing Weight: 80kg  Vital Signs: Temp: 98.4 F (36.9 C) (02/08 1931) Temp Source: Oral (02/08 1931) BP: 99/83 mmHg (02/08 2045) Pulse Rate: 115 (02/08 2045)  Labs:  Recent Labs  05/19/14 1940  HGB 17.6*  HCT 50.2  PLT 203  CREATININE 1.27  TROPONINI <0.03    Estimated Creatinine Clearance: 58.3 mL/min (by C-G formula based on Cr of 1.27).   Medical History: Past Medical History  Diagnosis Date  . Hypertension 2012  . Myocardial infarction 2003  . Hyperlipidemia 2012    Assessment: 21 YOM here with 2 week history of SOB that worsened today found to be in AFib with RVR. CHADS2 score = 1  Baseline hgb 17.6, plts 203. No bleeding noted. He was not on anticoagulation PTA.  Goal of Therapy:  Heparin level 0.3-0.7 units/ml Monitor platelets by anticoagulation protocol: Yes   Plan:  -heparin 4000 unit IV bolus x1 then start gtt at 1200 units/hr -first heparin level with AM labs so as not to wake patient in the middle of the night -daily heparin level and CBC -follow for s/s bleeding and long term AC plans  Maleyah Evans D. Domnick Chervenak, PharmD, BCPS Clinical Pharmacist Pager: [phone] 05/19/2014 9:12 PM

## 2014-05-19 NOTE — H&P (Signed)
Erik Blake is an 64 y.o. male.   Chief Complaint: Progressive increasing shortness of breath associated with cough and pleuritic chest pain associated with generalized weakness and weight loss HPI: Patient is 64 year old male with past medical history significant for coronary artery disease history of MI 3 in the past status post CABG status post PCI to protected left main/circumflex approximately 2 years ago, hypertension, glucose intolerance, hypercholesteremia, tobacco abuse, positive family history of coronary artery disease, achalasia of distal esophagus, GERD, approximately 50 pack years of tobacco abuse, and approximately 50 pound weight loss in the last 6 months, came to the ER complaining of progressive increasing shortness of breath associated with cough. A chest pain and generalized weakness and over appetite. Patient states for last few weeks he has been having difficulty swallowing and today and generalized weakness associated with coughing and palpitations so decided to come to the ED in ER patient was found to be in A. fib with RVR and chest x-ray showed left basilar infiltrate and right lung nodules subsequently had CT of the chest which showed metastatic lung disease with left endobronchial lesion and questionable distal esophageal mass with generalize esophageal dilatation.  Past Medical History  Diagnosis Date  . Hypertension 2012  . Myocardial infarction 2003  . Hyperlipidemia 2012    Past Surgical History  Procedure Laterality Date  . Coronary artery bypass graft  2003, 2015   . Left heart catheterization with coronary angiogram N/A 02/28/2012    Procedure: LEFT HEART CATHETERIZATION WITH CORONARY ANGIOGRAM;  Surgeon: Clent Demark, MD;  Location: [facility] CATH LAB;  Service: Cardiovascular;  Laterality: N/A;  . Percutaneous coronary stent intervention (pci-s) N/A 06/05/2012    Procedure: PERCUTANEOUS CORONARY STENT INTERVENTION (PCI-S);  Surgeon: Clent Demark, MD;   Location: [facility] CATH LAB;  Service: Cardiovascular;  Laterality: N/A;    Family History  Problem Relation Age of Onset  . Diabetes Mother   . Heart disease Mother   . Heart disease Father   . Diabetes Father   . Diabetes Sister     4 sisters   . Diabetes Brother   . Cancer Neg Hx    Social History:  reports that he has been smoking.  He has never used smokeless tobacco. He reports that he does not drink alcohol or use illicit drugs.  Allergies:  Allergies  Allergen Reactions  . Isosorbide Swelling     (Not in a hospital admission)  Results for orders placed or performed during the hospital encounter of 05/19/14 (from the past 48 hour(s))  Brain natriuretic peptide     Status: Abnormal   Collection Time: 05/19/14  7:38 PM  Result Value Ref Range   B Natriuretic Peptide 127.9 (H) 0.0 - 100.0 pg/mL  CBC with Differential     Status: Abnormal   Collection Time: 05/19/14  7:40 PM  Result Value Ref Range   WBC 13.0 (H) 4.0 - 10.5 K/uL   RBC 5.82 (H) 4.22 - 5.81 MIL/uL   Hemoglobin 17.6 (H) 13.0 - 17.0 g/dL   HCT 50.2 39.0 - 52.0 %   MCV 86.3 78.0 - 100.0 fL   MCH 30.2 26.0 - 34.0 pg   MCHC 35.1 30.0 - 36.0 g/dL   RDW 15.8 (H) 11.5 - 15.5 %   Platelets 203 150 - 400 K/uL   Neutrophils Relative % 80 (H) 43 - 77 %   Lymphocytes Relative 8 (L) 12 - 46 %   Monocytes Relative 9 3 - 12 %  Eosinophils Relative 2 0 - 5 %   Basophils Relative 1 0 - 1 %   Neutro Abs 10.4 (H) 1.7 - 7.7 K/uL   Lymphs Abs 1.0 0.7 - 4.0 K/uL   Monocytes Absolute 1.2 (H) 0.1 - 1.0 K/uL   Eosinophils Absolute 0.3 0.0 - 0.7 K/uL   Basophils Absolute 0.1 0.0 - 0.1 K/uL   RBC Morphology POLYCHROMASIA PRESENT    WBC Morphology ATYPICAL LYMPHOCYTES    Smear Review LARGE PLATELETS PRESENT   Comprehensive metabolic panel     Status: Abnormal   Collection Time: 05/19/14  7:40 PM  Result Value Ref Range   Sodium 130 (L) 135 - 145 mmol/L   Potassium 3.7 3.5 - 5.1 mmol/L   Chloride 92 (L) 96 - 112 mmol/L    CO2 27 19 - 32 mmol/L   Glucose, Bld 145 (H) 70 - 99 mg/dL   BUN 20 6 - 23 mg/dL   Creatinine, Ser 1.27 0.50 - 1.35 mg/dL   Calcium 9.8 8.4 - 10.5 mg/dL   Total Protein 7.5 6.0 - 8.3 g/dL   Albumin 3.1 (L) 3.5 - 5.2 g/dL   AST 172 (H) 0 - 37 U/L   ALT 131 (H) 0 - 53 U/L   Alkaline Phosphatase 671 (H) 39 - 117 U/L   Total Bilirubin 2.4 (H) 0.3 - 1.2 mg/dL   GFR calc non Af Amer 59 (L) >90 mL/min   GFR calc Af Amer 68 (L) >90 mL/min    Comment: (NOTE) The eGFR has been calculated using the CKD EPI equation. This calculation has not been validated in all clinical situations. eGFR's persistently <90 mL/min signify possible Chronic Kidney Disease.    Anion gap 11 5 - 15  Troponin I     Status: None   Collection Time: 05/19/14  7:40 PM  Result Value Ref Range   Troponin I <0.03 <0.031 ng/mL    Comment:        NO INDICATION OF MYOCARDIAL INJURY.   I-Stat Troponin, ED (not at [facility])     Status: None   Collection Time: 05/19/14  7:46 PM  Result Value Ref Range   Troponin i, poc 0.00 0.00 - 0.08 ng/mL   Comment 3            Comment: Due to the release kinetics of cTnI, a negative result within the first hours of the onset of symptoms does not rule out myocardial infarction with certainty. If myocardial infarction is still suspected, repeat the test at appropriate intervals.   I-Stat CG4 Lactic Acid, ED     Status: Abnormal   Collection Time: 05/19/14  9:27 PM  Result Value Ref Range   Lactic Acid, Venous 4.20 (HH) 0.5 - 2.0 mmol/L   Comment NOTIFIED PHYSICIAN    Ct Angio Chest Pe W/cm &/or Wo Cm  05/19/2014   CLINICAL DATA:  Shortness of Breath  EXAM: CT ANGIOGRAPHY CHEST WITH CONTRAST  TECHNIQUE: Multidetector CT imaging of the chest was performed using the standard protocol during bolus administration of intravenous contrast. Multiplanar CT image reconstructions and MIPs were obtained to evaluate the vascular anatomy.  CONTRAST:  156m OMNIPAQUE IOHEXOL 350 MG/ML SOLN  COMPARISON:   Chest radiograph May 19, 2014  FINDINGS: There is no demonstrable pulmonary embolus. There is no appreciable thoracic aortic aneurysm.  There are innumerable small pulmonary nodular lesions throughout the lungs consistent with metastatic foci. Nodular lesions range in size from as small as 2 mm to as large  as 1.8 x 1.7 cm on the right and in the left mid lung measuring 1.6 x 1.3 cm. There is consolidation throughout much of the left lower lobe.  There is an enlarged lymph node in the aortopulmonary window region measuring 2.0 x 1.6 cm. There is adenopathy to the left of the trachea measuring 2.8 by 1.5 cm. There is adenopathy anterior to the carina measuring 1.9 x 1.7 cm. There is sub- carinal adenopathy measuring 3.4 x 1.8 cm. There is probable adenopathy in the left hilum which is indistinguishable from the adjacent airspace consolidation. There may well be an endobronchial mass in the left lower lobe as well.  There is fluid in wall thickening throughout the esophagus from the level of the aortic arch to the gastroesophageal junction. Proximal to this area, there is distention esophagus with air.  Pericardium is not thickened. Patient is status post coronary artery bypass grafting.  In the visualized upper abdomen, mild ascites is appreciable. There is a nodular lesion in the posterior segment of the right lobe of the liver measuring 1.7 x 1.5 cm.  There are no blastic or lytic bone lesions. There is slight anterior wedging of 2 mid thoracic vertebral bodies. Thyroid appears unremarkable.  Review of the MIP images confirms the above findings.  IMPRESSION: No demonstrable pulmonary embolus.  Innumerable pulmonary nodular lesions throughout the lungs consistent with widespread metastatic disease. There also foci of adenopathy. There is left lower lobe consolidation. There may be an endobronchial lesion obstructing the left lower lobe bronchus.  Generalized esophageal dilatation with fluid extending from the  level of the aortic arch to the gastroesophageal junction. This finding could indicate a distal esophageal mass. Direct visualization may be advisable given this appearance.  Small nodular lesion in the right lobe of liver, concerning for metastatic focus given other findings.   Electronically Signed   By: Lowella Grip M.D.   On: 05/19/2014 21:42   Dg Chest Portable 1 View  05/19/2014   CLINICAL DATA:  Cough and shortness of breath for 3 weeks  EXAM: PORTABLE CHEST - 1 VIEW  COMPARISON:  None.  FINDINGS: There is underlying emphysema. There is airspace consolidation in the left lower lobe.  There is a 1.8 x 1.5 cm nodular lesion in the right mid lung.  Heart size is normal. Pulmonary vascularity reflects underlying emphysema. Patient is status post coronary artery bypass grafting. No adenopathy.  IMPRESSION: 1.8 x 1.5 cm nodular lesion right mid lung. Advise noncontrast enhanced chest CT to further evaluate.  Left lower lobe consolidation.  Underlying emphysema.   Electronically Signed   By: Lowella Grip M.D.   On: 05/19/2014 20:14    Review of Systems  Constitutional: Positive for weight loss and malaise/fatigue.  Eyes: Negative for blurred vision and double vision.  Respiratory: Positive for cough and shortness of breath. Negative for hemoptysis.   Cardiovascular: Positive for chest pain and palpitations. Negative for orthopnea, claudication and leg swelling.  Gastrointestinal: Positive for nausea, vomiting and abdominal pain.  Genitourinary: Negative for dysuria and urgency.  Neurological: Positive for dizziness and weakness.    Blood pressure 107/75, pulse 31, temperature 98.4 F (36.9 C), temperature source Oral, resp. rate 16, weight 80.74 kg (178 lb), SpO2 94 %. Physical Exam  Constitutional: He is oriented to person, place, and time.  HENT:  Head: Normocephalic and atraumatic.  Eyes: Conjunctivae are normal. Pupils are equal, round, and reactive to light. Left eye exhibits no  discharge. No scleral icterus.  Neck:  Normal range of motion. Neck supple. No JVD present. No tracheal deviation present. No thyromegaly present.  Cardiovascular:  Irregularly irregular tachycardic S1 and S2 soft  Respiratory: He has no wheezes. He has no rales.  Decreased breath sound at left base with rhonchi  GI: Soft. Bowel sounds are normal. He exhibits no distension. There is no tenderness. There is no rebound.  Musculoskeletal: He exhibits no edema or tenderness.  Lymphadenopathy:    He has no cervical adenopathy.  Neurological: He is alert and oriented to person, place, and time.     Assessment/Plan Atypical chest pain New-onset A. fib with RVR Left lower lobe pneumonia Metastatic lung disease of unknown primary Achalasia distal esophagus with generalized esophageal dilation rule out esophageal mass Coronary artery disease history of MI 3 in the past status post CABG status post PCI to protected left main and left circumflex approximately 2 years ago Hypertension Prediabetic History of Tobacco abuse approximately 50 pack years Significant weight loss and cachexia Depression Positive family history of coronary artery disease Plan As per orders Pulmonary consult GI consult   Beauty Pless N 05/19/2014, 9:53 PM

## 2014-05-20 ENCOUNTER — Encounter (HOSPITAL_COMMUNITY): Payer: Self-pay | Admitting: Pulmonary Disease

## 2014-05-20 DIAGNOSIS — E46 Unspecified protein-calorie malnutrition: Secondary | ICD-10-CM

## 2014-05-20 DIAGNOSIS — R918 Other nonspecific abnormal finding of lung field: Secondary | ICD-10-CM

## 2014-05-20 DIAGNOSIS — R683 Clubbing of fingers: Secondary | ICD-10-CM

## 2014-05-20 DIAGNOSIS — I4891 Unspecified atrial fibrillation: Secondary | ICD-10-CM

## 2014-05-20 DIAGNOSIS — J189 Pneumonia, unspecified organism: Principal | ICD-10-CM

## 2014-05-20 DIAGNOSIS — K769 Liver disease, unspecified: Secondary | ICD-10-CM

## 2014-05-20 DIAGNOSIS — D72829 Elevated white blood cell count, unspecified: Secondary | ICD-10-CM

## 2014-05-20 DIAGNOSIS — E43 Unspecified severe protein-calorie malnutrition: Secondary | ICD-10-CM | POA: Insufficient documentation

## 2014-05-20 DIAGNOSIS — K229 Disease of esophagus, unspecified: Secondary | ICD-10-CM

## 2014-05-20 DIAGNOSIS — Z87891 Personal history of nicotine dependence: Secondary | ICD-10-CM

## 2014-05-20 LAB — COMPREHENSIVE METABOLIC PANEL
ALBUMIN: 2.8 g/dL — AB (ref 3.5–5.2)
ALK PHOS: 574 U/L — AB (ref 39–117)
ALT: 113 U/L — ABNORMAL HIGH (ref 0–53)
ANION GAP: 8 (ref 5–15)
AST: 156 U/L — ABNORMAL HIGH (ref 0–37)
BUN: 17 mg/dL (ref 6–23)
CO2: 25 mmol/L (ref 19–32)
CREATININE: 0.91 mg/dL (ref 0.50–1.35)
Calcium: 9 mg/dL (ref 8.4–10.5)
Chloride: 97 mmol/L (ref 96–112)
GFR calc non Af Amer: 89 mL/min — ABNORMAL LOW (ref >90)
GLUCOSE: 105 mg/dL — AB (ref 70–99)
Potassium: 3.8 mmol/L (ref 3.5–5.1)
Sodium: 130 mmol/L — ABNORMAL LOW (ref 135–145)
TOTAL PROTEIN: 6.7 g/dL (ref 6.0–8.3)
Total Bilirubin: 3.1 mg/dL — ABNORMAL HIGH (ref 0.3–1.2)

## 2014-05-20 LAB — CBC WITH DIFFERENTIAL/PLATELET
BASOS ABS: 0 10*3/uL (ref 0.0–0.1)
Basophils Relative: 0 % (ref 0–1)
Eosinophils Absolute: 0.2 10*3/uL (ref 0.0–0.7)
Eosinophils Relative: 2 % (ref 0–5)
HCT: 44.8 % (ref 39.0–52.0)
Hemoglobin: 15.1 g/dL (ref 13.0–17.0)
LYMPHS ABS: 1.3 10*3/uL (ref 0.7–4.0)
Lymphocytes Relative: 11 % — ABNORMAL LOW (ref 12–46)
MCH: 29.4 pg (ref 26.0–34.0)
MCHC: 33.7 g/dL (ref 30.0–36.0)
MCV: 87.2 fL (ref 78.0–100.0)
MONOS PCT: 9 % (ref 3–12)
Monocytes Absolute: 1 10*3/uL (ref 0.1–1.0)
NEUTROS PCT: 78 % — AB (ref 43–77)
Neutro Abs: 9.1 10*3/uL — ABNORMAL HIGH (ref 1.7–7.7)
PLATELETS: 179 10*3/uL (ref 150–400)
RBC: 5.14 MIL/uL (ref 4.22–5.81)
RDW: 15.7 % — AB (ref 11.5–15.5)
WBC: 11.6 10*3/uL — AB (ref 4.0–10.5)

## 2014-05-20 LAB — CBC
HCT: 44.3 % (ref 39.0–52.0)
Hemoglobin: 15 g/dL (ref 13.0–17.0)
MCH: 29.6 pg (ref 26.0–34.0)
MCHC: 33.9 g/dL (ref 30.0–36.0)
MCV: 87.4 fL (ref 78.0–100.0)
PLATELETS: 150 10*3/uL (ref 150–400)
RBC: 5.07 MIL/uL (ref 4.22–5.81)
RDW: 16 % — AB (ref 11.5–15.5)
WBC: 11.3 10*3/uL — AB (ref 4.0–10.5)

## 2014-05-20 LAB — LIPID PANEL
Cholesterol: 168 mg/dL (ref 0–200)
HDL: 20 mg/dL — ABNORMAL LOW (ref >39)
LDL CALC: 128 mg/dL — AB (ref 0–99)
Total CHOL/HDL Ratio: 8.4 RATIO
Triglycerides: 102 mg/dL (ref <150)
VLDL: 20 mg/dL (ref 0–40)

## 2014-05-20 LAB — STREP PNEUMONIAE URINARY ANTIGEN: Strep Pneumo Urinary Antigen: NEGATIVE

## 2014-05-20 LAB — HEPARIN LEVEL (UNFRACTIONATED)
HEPARIN UNFRACTIONATED: 0.35 [IU]/mL (ref 0.30–0.70)
Heparin Unfractionated: 0.11 IU/mL — ABNORMAL LOW (ref 0.30–0.70)
Heparin Unfractionated: 0.35 IU/mL (ref 0.30–0.70)

## 2014-05-20 LAB — BASIC METABOLIC PANEL
Anion gap: 15 (ref 5–15)
BUN: 17 mg/dL (ref 6–23)
CALCIUM: 8.9 mg/dL (ref 8.4–10.5)
CO2: 20 mmol/L (ref 19–32)
CREATININE: 0.83 mg/dL (ref 0.50–1.35)
Chloride: 96 mmol/L (ref 96–112)
GFR calc Af Amer: >90 mL/min (ref >90)
GLUCOSE: 84 mg/dL (ref 70–99)
Potassium: 3.7 mmol/L (ref 3.5–5.1)
Sodium: 131 mmol/L — ABNORMAL LOW (ref 135–145)

## 2014-05-20 LAB — PROTIME-INR
INR: 1.18 (ref 0.00–1.49)
Prothrombin Time: 15.1 seconds (ref 11.6–15.2)

## 2014-05-20 LAB — EXPECTORATED SPUTUM ASSESSMENT W REFEX TO RESP CULTURE

## 2014-05-20 LAB — EXPECTORATED SPUTUM ASSESSMENT W GRAM STAIN, RFLX TO RESP C

## 2014-05-20 LAB — APTT: APTT: 73 s — AB (ref 24–37)

## 2014-05-20 MED ORDER — HEPARIN BOLUS VIA INFUSION
2500.0000 [IU] | Freq: Once | INTRAVENOUS | Status: AC
  Administered 2014-05-20: 2500 [IU] via INTRAVENOUS
  Filled 2014-05-20: qty 2500

## 2014-05-20 MED ORDER — AMIODARONE LOAD VIA INFUSION
150.0000 mg | Freq: Once | INTRAVENOUS | Status: AC
  Administered 2014-05-20: 150 mg via INTRAVENOUS
  Filled 2014-05-20: qty 83.34

## 2014-05-20 MED ORDER — AMIODARONE HCL IN DEXTROSE 360-4.14 MG/200ML-% IV SOLN
30.0000 mg/h | INTRAVENOUS | Status: DC
  Administered 2014-05-20 – 2014-05-23 (×5): 30 mg/h via INTRAVENOUS
  Filled 2014-05-20 (×13): qty 200

## 2014-05-20 MED ORDER — ALBUTEROL SULFATE (2.5 MG/3ML) 0.083% IN NEBU: 2.5000 mg | INHALATION_SOLUTION | RESPIRATORY_TRACT | Status: DC | PRN

## 2014-05-20 MED ORDER — AMIODARONE HCL IN DEXTROSE 360-4.14 MG/200ML-% IV SOLN
60.0000 mg/h | INTRAVENOUS | Status: AC
  Administered 2014-05-20: 60 mg/h via INTRAVENOUS
  Filled 2014-05-20: qty 200

## 2014-05-20 MED ORDER — ENSURE COMPLETE PO LIQD
237.0000 mL | Freq: Two times a day (BID) | ORAL | Status: DC
  Administered 2014-05-20 (×2): 237 mL via ORAL

## 2014-05-20 MED ORDER — SODIUM CHLORIDE 0.9 % IV SOLN: INTRAVENOUS | Status: DC

## 2014-05-20 NOTE — Consult Note (Signed)
[place]  Telephone:([phone]) [place] NOTE  TRASEAN DELIMA                                MR#: [id number]  DOB: 05/09/1950                       CSN#: [id number]  Referring MD: Dr. Sheliah [facility]  Primary MD: Dr. Adrian Blackwater  Reason for Consult: Lung, Esophageal, Liver lesions   [id number] R Erik Blake is a 64 y.o.  [place] man admitted with progressive, 6 month history of shortness of breath associated with pleuritic chest pain and palpitations on the day of presentation. He also reported cough without hemoptysis. Patient reports decreased appetite and a 50 lb weight loss over the last 3 months. He complains of  worsening dysphagia.  Patient denies abdominal pain, nausea or vomiting. He denies any bleeding issues such as hematemesis, or hematochezia. No urinary complaints. He has been increasingly fatigued with dizziness without vertigo. He denies headaches or vision changes. He is a heavy smoker. No alcohol habituation.   He has been experiencing increased salivary secretions since September 2015. Of note, a Barium swallow at the time was negative for malignancy.  EGD was recommended, but patient failed to follow up.   On admission he was found to be in Atrial Fibrillation with RVR requiring antiarrythmics, followed by Cardiology. Other workup included a chest x ray on 2/8, remarkable for left basilar infiltrate and right lung nodules. A follow up CT angio of the chest with contrast on 2/8  was negative for pulmonary embolism but revealed innumerable pulmonary nodular lesions throughout the lungs consistent with widespread metastatic disease. A suspicious endobronchial lesion obstructing the left lower lobe bronchus was noted. In addition, generalized esophageal dilatation with fluid extending from the level of the aortic arch to the gastroesophageal junction was seen, worrisome for a distal esophageal mass. A small nodular lesion in the  right lobe of liver, concerning for metastatic focus was also visualized.   We were consulted regarding management once definitive diagnosis iss estabished  PMH:  Past Medical History  Diagnosis Date  . Hypertension 2012  . Myocardial infarction 2003  . Hyperlipidemia 2012  . 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 Tobacco abuse   . CAD (coronary artery disease)   . Weight loss 05/2014    Approx 50 lbs  . GERD (gastroesophageal reflux disease)   History of glucose intolerance Osteoarthritis of the knees, requiring corticosteroid injections  Surgeries:  Past Surgical History  Procedure Laterality Date  . Coronary artery bypass graft  2003, 2015   . Left heart catheterization with coronary angiogram N/A 02/28/2012    Procedure: LEFT HEART CATHETERIZATION WITH CORONARY ANGIOGRAM;  Surgeon: Clent Demark, MD;  Location: [facility] CATH LAB;  Service: Cardiovascular;  Laterality: N/A;  . Percutaneous coronary stent intervention (pci-s) N/A 06/05/2012    Procedure: PERCUTANEOUS CORONARY STENT INTERVENTION (PCI-S);  Surgeon: Clent Demark, MD;  Location: [facility] CATH LAB;  Service: Cardiovascular;  Laterality: N/A;    Allergies:  Allergies  Allergen Reactions  . Isosorbide Swelling    Medications:   Scheduled Meds: . ALPRAZolam  0.5 mg Oral BID  . aspirin EC  81 mg Oral Daily  . azithromycin  500 mg Intravenous Q24H  . cefTRIAXone (ROCEPHIN)  IV  1 g Intravenous Q24H  . feeding supplement (ENSURE  COMPLETE)  237 mL Oral BID BM  . heparin  2,500 Units Intravenous Once  . metoprolol tartrate  25 mg Oral BID  . pantoprazole (PROTONIX) IV  40 mg Intravenous Q24H  . PARoxetine  20 mg Oral Daily   Continuous Infusions: . sodium chloride 75 mL/hr at 05/20/14 1040  . amiodarone 60 mg/hr (05/20/14 1027)   Followed by  . amiodarone    . heparin 1,450 Units/hr (05/20/14 1027)   PRN Meds:.acetaminophen, albuterol, nitroGLYCERIN, ondansetron (ZOFRAN) IV  ROS: Constitutional: Denies fevers,  chills or abnormal night sweats. He had a 60 lb weight Loss over the last 3 months, accompanied by increased malaise and fatigue, and inability to sleep. Eyes: Denies blurriness of vision, double vision or watery eyes Ears, nose, mouth, throat, and face: Denies mucositis, has reported difficulty swallowing solids, increased salivary secretions. Respiratory: He reports increasing cough, and shortness of breath, accompanied by pleuritic chest pain when he was admitted. He denies any hemoptysis, or epistaxis. Cardiovascular: The patient reported palpitations, and chest pain on admission. Gastrointestinal: The patient has been experiencing some nausea, no vomiting. He has decreased appetite, with weight loss as mentioned above. The patient has known achalasia, Has noted over the last 3 weeks to have increased dysphagia, trouble swallowing foods. He denies trouble swallowing liquids. He denies any abdominal pain. He denies any hematochezia. He denies any change in the caliber of stools. He denies melena. Skin: Denies abnormal skin rashes Lymphatics: Denies new lymphadenopathy or easy bruising Neurological:Denies numbness, tingling or new weaknesses. He denies any seizure episodes. He denies any headaches or vision changes. Behavioral/Psych: He has noted to be more depressed, and has insomnia over the last 6 months. All other systems were reviewed with the patient and are negative.   Family History:    Family History  Problem Relation Age of Onset  . Diabetes Mother   . Heart disease Mother   . Heart disease Father   . Diabetes Father   . Diabetes Sister     4 sisters   . Diabetes Brother   . Cancer Neg Hx    No family history of blood disorders.  Health maintenance:  Colonoscopy 07/2001, negative Up to date with vaccinations  Social History: Married, wife's name is Mcgwire Dasaro; lives in [place], [place]. He is disabled due to cardiac disease. Before that, he worked as a Photographer. He is retired from [employer] . He denies any warime chemical exposure.  He has smoked 1-2 pack a day for at least 40 years. He has never used smokeless tobacco. He reports that he does not drink alcohol or use illicit drugs.   Physical Exam   ECOG PERFORMANCE STATUS: 2   Filed Vitals:   05/20/14 1040  BP: 95/78  Pulse:   Temp:   Resp:    Filed Weights   05/19/14 1931 05/19/14 2347  Weight: 178 lb (80.74 kg) 170 lb 6.7 oz (77.3 kg)    GENERAL:alert, no distress and comfortable. Hard of hearing. SKIN: skin color, texture, turgor are normal, no rashes or significant lesions EYES: normal, conjunctiva are pink and non-injected, sclera clear OROPHARYNX:no exudate, no erythema and lips, buccal mucosa, and tongue normal  NECK: supple, thyroid normal size, non-tender, without nodularity LYMPH:  no palpable lymphadenopathy in the cervical, axillary or inguinal LUNGS:He has decreased breath sounds on the left. The patient has audible rhonchi, no rales or wheezing. HEART: irregularly irregular rate & rhythm and no murmurs and no lower extremity edema  ABDOMEN:abdomen soft, non-tender and normal bowel sounds Musculoskeletal:no cyanosis of digits and no clubbing  PSYCH: alert & oriented x 3 with fluent speech NEURO: no focal motor/sensory deficits  CBC  Recent Labs Lab 05/19/14 1940 05/20/14 0047 05/20/14 0705  WBC 13.0* 11.6* 11.3*  HGB 17.6* 15.1 15.0  HCT 50.2 44.8 44.3  PLT 203 179 150  MCV 86.3 87.2 87.4  MCH 30.2 29.4 29.6  MCHC 35.1 33.7 33.9  RDW 15.8* 15.7* 16.0*  LYMPHSABS 1.0 1.3  --   MONOABS 1.2* 1.0  --   EOSABS 0.3 0.2  --   BASOSABS 0.1 0.0  --     Anemia panel:  No results for input(s): VITAMINB12, FOLATE, FERRITIN, TIBC, IRON, RETICCTPCT in the last 72 hours.  CMP    Recent Labs Lab 05/19/14 1940 05/20/14 0047 05/20/14 0705  NA 130* 130* 131*  K 3.7 3.8 3.7  CL 92* 97 96  CO2 27 25 20   GLUCOSE 145* 105* 84  BUN 20 17 17   CREATININE  1.27 0.91 0.83  CALCIUM 9.8 9.0 8.9  AST 172* 156*  --   ALT 131* 113*  --   ALKPHOS 671* 574*  --   BILITOT 2.4* 3.1*  --         Component Value Date/Time   BILITOT 3.1* 05/20/2014 0047      Recent Labs Lab 05/20/14 0047  INR 1.18    No results for input(s): DDIMER in the last 72 hours.  Imaging Studies:  Ct Angio Chest Pe W/cm &/or Wo Cm  05/19/2014   CLINICAL DATA:  Shortness of Breath  EXAM: CT ANGIOGRAPHY CHEST WITH CONTRAST  TECHNIQUE: Multidetector CT imaging of the chest was performed using the standard protocol during bolus administration of intravenous contrast. Multiplanar CT image reconstructions and MIPs were obtained to evaluate the vascular anatomy.  CONTRAST:  120mL OMNIPAQUE IOHEXOL 350 MG/ML SOLN  COMPARISON:  Chest radiograph May 19, 2014  FINDINGS: There is no demonstrable pulmonary embolus. There is no appreciable thoracic aortic aneurysm.  There are innumerable small pulmonary nodular lesions throughout the lungs consistent with metastatic foci. Nodular lesions range in size from as small as 2 mm to as large as 1.8 x 1.7 cm on the right and in the left mid lung measuring 1.6 x 1.3 cm. There is consolidation throughout much of the left lower lobe.  There is an enlarged lymph node in the aortopulmonary window region measuring 2.0 x 1.6 cm. There is adenopathy to the left of the trachea measuring 2.8 by 1.5 cm. There is adenopathy anterior to the carina measuring 1.9 x 1.7 cm. There is sub- carinal adenopathy measuring 3.4 x 1.8 cm. There is probable adenopathy in the left hilum which is indistinguishable from the adjacent airspace consolidation. There may well be an endobronchial mass in the left lower lobe as well.  There is fluid in wall thickening throughout the esophagus from the level of the aortic arch to the gastroesophageal junction. Proximal to this area, there is distention esophagus with air.  Pericardium is not thickened. Patient is status post coronary  artery bypass grafting.  In the visualized upper abdomen, mild ascites is appreciable. There is a nodular lesion in the posterior segment of the right lobe of the liver measuring 1.7 x 1.5 cm.  There are no blastic or lytic bone lesions. There is slight anterior wedging of 2 mid thoracic vertebral bodies. Thyroid appears unremarkable.  Review of the MIP images confirms the above findings.  IMPRESSION: No demonstrable pulmonary embolus.  Innumerable pulmonary nodular lesions throughout the lungs consistent with widespread metastatic disease. There also foci of adenopathy. There is left lower lobe consolidation. There may be an endobronchial lesion obstructing the left lower lobe bronchus.  Generalized esophageal dilatation with fluid extending from the level of the aortic arch to the gastroesophageal junction. This finding could indicate a distal esophageal mass. Direct visualization may be advisable given this appearance.  Small nodular lesion in the right lobe of liver, concerning for metastatic focus given other findings.   Electronically Signed   By: Lowella Grip M.D.   On: 05/19/2014 21:42   Dg Chest Portable 1 View  05/19/2014   CLINICAL DATA:  Cough and shortness of breath for 3 weeks  EXAM: PORTABLE CHEST - 1 VIEW  COMPARISON:  None.  FINDINGS: There is underlying emphysema. There is airspace consolidation in the left lower lobe.  There is a 1.8 x 1.5 cm nodular lesion in the right mid lung.  Heart size is normal. Pulmonary vascularity reflects underlying emphysema. Patient is status post coronary artery bypass grafting. No adenopathy.  IMPRESSION: 1.8 x 1.5 cm nodular lesion right mid lung. Advise noncontrast enhanced chest CT to further evaluate.  Left lower lobe consolidation.  Underlying emphysema.   Electronically Signed   By: Lowella Grip M.D.   On: 05/19/2014 20:14     Assessment/Plan: 64 y.o.   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  Workup included a CT angio of the chest with contrast on 2/8, negative for PE, but revealing innumerable pulmonary nodular lesions throughout the lungs with adenopathy, an endobronchial lesion obstructing the left lower lobe bronchus; also seen, generalized esophageal dilatation with fluid extending from the level of the aortic arch to the gastroesophageal junction worrisome for distal esophageal mass.  A small nodular lesion in the right lobe of liver, concerning for metastatic focus was visualized.  Due to his history of tobacco abuse, lung cancer will need to be ruled out, Although however primary disease may be possible, including esophageal malignancy.  The patient is to undergo fiberoptic bronchoscopy when cleared by cardiology. In the interim, completion of staging CTs of the abdomen and pelvis and brain imaging to rule out occult metastatic disease is advisable Once diagnosis becomes available, we will proceed with further recommendations.   Left lower lobe pneumonia Likely postobstructive, in the setting of left lower lobe endobronchial lesion. Appreciate Pulmonary involvement, with antibiotics, oxygen, nebulizers, and pulmonary hygiene  History of tobacco abuse Tobacco cessation program recommended  Malnutrition The patient has lost 60 pounds over the last few months, with decreased appetite in the setting of malignancy. Agree with  nutrition consultation to improve his status, anticipating that he will require treatment  Leukocytosis This is in the setting of tobacco abuse, Infection and malignancy This is trending down to normal No intervention is indicated at this time Will continue to monitor  DVT prophylaxis The patient is on heparin drip by pharmacy  New onset of atrial fibrillation This is in the setting of new malignancy visualized per CT scan of the chest 2-D echo is currently  pending Appreciate cardiology involvement  Full Code   Other medical issues including CAD, hyperlipidemia, hypertension, glucose intolerance, as per admitting team    [facility] E, PA-C 05/20/2014 10:42 AM  ADDENDUM: Concur with APP's note above, which I have slightly revised. In brief:  Mr Erik Blake is a 64 year old [place] man presenting with evidence of systemic disease (multiple bilateral lung lesions, a possible distal esophageal stricture, adenopathy, liver lesion). We will need biopsy for diagnosis and targets include the L lower lobe endobronchial lesion and the distal esophagus. We can complete staging with abd/pelvic CT when/if esophageal blockage is stented or improved and a brain MRI when the patient is more stable. I will send for a baseline CEA and CA 19. Once we have a more definitive diagnosis I will meet with the patient and his family to discuss treatment vs palliative options.   Appreciate consult. Will follow with you.

## 2014-05-20 NOTE — Progress Notes (Signed)
INITIAL NUTRITION ASSESSMENT  DOCUMENTATION CODES Per approved criteria  -Severe malnutrition in the context of chronic illness   Erik Blake meets criteria for severe MALNUTRITION in the context of chronic illness as evidenced by 27.7% wt loss x 1 year, <75% of estimated energy intake x 1 month.  INTERVENTION: -Continue with Ensure Complete po BID, each supplement provides 350 kcal and 13 grams of protein  NUTRITION DIAGNOSIS: Increased nutrient needs related to suspected malignancy as evidenced by 27.7% wt loss x 1 year.   Goal: Erik Blake will meet >90% of estimated nutritional needs  Monitor:  Diet advancement, PO/supplement intake, labs, weight changes, I/O's  Reason for Assessment: MST=5  64 y.o. male  Admitting Dx: <principal problem not specified>  Patient is 64 year old male with past medical history significant for coronary artery disease history of MI 3 in the past status post CABG status post PCI to protected left main/circumflex approximately 2 years ago, hypertension, glucose intolerance, hypercholesteremia, tobacco abuse, positive family history of coronary artery disease, achalasia of distal esophagus, GERD, approximately 50 pack years of tobacco abuse, and approximately 50 pound weight loss in the last 6 months, came to the ER complaining of progressive increasing shortness of breath associated with cough.  ASSESSMENT: Erik Blake admitted with possible distal esophageal mass.  Erik Blake refused interview and exam, reporting he wanted to get some rest. Hx obtained via chart review. Erik Blake has been experiencing dysphagia, nothing that food hangs in the lower esophagus when swallowed, per GI notes. Erik Blake to be NPO at midnight for an EGD to rule out esophageal mass.  Wt hx reveals UBW of 235#. Noted a  65# (27.7%) wt loss x 1 year, which is significant.  Documented meal intake is 100%. Ensure Complete already ordered. Will continue supplement.  Labs reviewed. Na: 131.  Height: Ht Readings from Last 1  Encounters:  05/19/14 5\' 8"  (1.727 m)    Weight: Wt Readings from Last 1 Encounters:  05/19/14 170 lb 6.7 oz (77.3 kg)    Ideal Body Weight: 154#  % Ideal Body Weight: 110%  Wt Readings from Last 10 Encounters:  05/19/14 170 lb 6.7 oz (77.3 kg)  01/08/14 193 lb (87.544 kg)  12/23/13 197 lb (89.359 kg)  06/05/12 235 lb (106.595 kg)  02/28/12 235 lb (106.595 kg)    Usual Body Weight: 235#  % Usual Body Weight: 72%  BMI:  Body mass index is 25.92 kg/(m^2). Overweight  Estimated Nutritional Needs: Kcal: 2100-2300 Protein: 92-102 grams Fluid: 2.1-2.3 L  Skin: WDL  Diet Order: Diet heart healthy/carb modified  EDUCATION NEEDS: -Education not appropriate at this time   Intake/Output Summary (Last 24 hours) at 05/20/14 0938 Last data filed at 05/20/14 0900  Gross per 24 hour  Intake    240 ml  Output    300 ml  Net    -60 ml    Last BM: 05/19/14  Labs:   Recent Labs Lab 05/19/14 1940 05/20/14 0047 05/20/14 0705  NA 130* 130* 131*  K 3.7 3.8 3.7  CL 92* 97 96  CO2 27 25 20   BUN 20 17 17   CREATININE 1.27 0.91 0.83  CALCIUM 9.8 9.0 8.9  GLUCOSE 145* 105* 84    CBG (last 3)  No results for input(s): GLUCAP in the last 72 hours.  Scheduled Meds: . ALPRAZolam  0.5 mg Oral BID  . amiodarone  150 mg Intravenous Once  . aspirin EC  81 mg Oral Daily  . azithromycin  500 mg Intravenous Q24H  .  cefTRIAXone (ROCEPHIN)  IV  1 g Intravenous Q24H  . feeding supplement (ENSURE COMPLETE)  237 mL Oral BID BM  . metoprolol tartrate  25 mg Oral BID  . pantoprazole (PROTONIX) IV  40 mg Intravenous Q24H  . PARoxetine  20 mg Oral Daily    Continuous Infusions: . sodium chloride 75 mL/hr at 05/20/14 0052  . amiodarone     Followed by  . amiodarone    . heparin 1,200 Units/hr (05/19/14 2234)    Past Medical History  Diagnosis Date  . Hypertension 2012  . Myocardial infarction 2003  . Hyperlipidemia 2012  . Achalasia of esophagus   . Tobacco abuse   . CAD  (coronary artery disease)   . Weight loss 05/2014    Approx 50 lbs  . GERD (gastroesophageal reflux disease)     Past Surgical History  Procedure Laterality Date  . Coronary artery bypass graft  2003, 2015   . Left heart catheterization with coronary angiogram N/A 02/28/2012    Procedure: LEFT HEART CATHETERIZATION WITH CORONARY ANGIOGRAM;  Surgeon: Clent Demark, MD;  Location: [facility] CATH LAB;  Service: Cardiovascular;  Laterality: N/A;  . Percutaneous coronary stent intervention (pci-s) N/A 06/05/2012    Procedure: PERCUTANEOUS CORONARY STENT INTERVENTION (PCI-S);  Surgeon: Clent Demark, MD;  Location[facility] CATH LAB;  Service: Cardiovascular;  Laterality: N/A;    Maryum Batterson A. Jimmye Norman, RD, LDN, CDE Pager: [phone] After hours Pager: [phone]

## 2014-05-20 NOTE — Progress Notes (Signed)
  Echocardiogram 2D Echocardiogram has been performed.  Erik Blake 05/20/2014, 5:55 PM

## 2014-05-20 NOTE — Consult Note (Signed)
Subjective:   HPI  The patient is a 64 year old male with multiple medical problems who we are asked to see in regards to dysphagia and the possibility of a distal esophageal mass. He has been experiencing dysphagia for about 6-8 months. He feels like food goes down then hangs up in the lower esophagus and then comes back up. There was a question last year on a barium swallow of possible achalasia. An endoscopy was recommended but the patient never saw anybody to have that done. It appears that he has widespread pulmonary metastasis of unknown etiology. He denies heartburn.     Past Medical History  Diagnosis Date  . Hypertension 2012  . Myocardial infarction 2003  . Hyperlipidemia 2012  . Achalasia of esophagus   . Tobacco abuse   . CAD (coronary artery disease)   . Weight loss 05/2014    Approx 50 lbs  . GERD (gastroesophageal reflux disease)    Past Surgical History  Procedure Laterality Date  . Coronary artery bypass graft  2003, 2015   . Left heart catheterization with coronary angiogram N/A 02/28/2012    Procedure: LEFT HEART CATHETERIZATION WITH CORONARY ANGIOGRAM;  Surgeon: Clent Demark, MD;  Location: [facility] CATH LAB;  Service: Cardiovascular;  Laterality: N/A;  . Percutaneous coronary stent intervention (pci-s) N/A 06/05/2012    Procedure: PERCUTANEOUS CORONARY STENT INTERVENTION (PCI-S);  Surgeon: Clent Demark, MD;  Location: [facility] CATH LAB;  Service: Cardiovascular;  Laterality: N/A;   History   Social History  . Marital Status: Married    Spouse Name: N/A    Number of Children: 2   . Years of Education: 9   Occupational History  . Disability     Social History Main Topics  . Smoking status: Current Every Day Smoker  . Smokeless tobacco: Never Used  . Alcohol Use: No  . Drug Use: No  . Sexual Activity: No   Other Topics Concern  . Not on file   Social History Narrative   Married.    No sex since 2013 due to wife's concern about heart attack   2 daughters  one in [place], one in [place]   family history includes Diabetes in his brother, father, mother, and sister; Heart disease in his father and mother. There is no history of Cancer.  Current facility-administered medications:  .  0.9 %  sodium chloride infusion, , Intravenous, Continuous, Clent Demark, MD, Last Rate: 75 mL/hr at 05/20/14 1040 .  acetaminophen (TYLENOL) tablet 650 mg, 650 mg, Oral, Q4H PRN, Clent Demark, MD .  albuterol (PROVENTIL) (2.5 MG/3ML) 0.083% nebulizer solution 2.5 mg, 2.5 mg, Nebulization, Q3H PRN, Donita Brooks, NP .  ALPRAZolam Duanne Moron) tablet 0.5 mg, 0.5 mg, Oral, BID, Clent Demark, MD, 0.5 mg at 05/20/14 1026 .  [COMPLETED] amiodarone (NEXTERONE) 1.8 mg/mL load via infusion 150 mg, 150 mg, Intravenous, Once, 150 mg at 05/20/14 0915 **FOLLOWED BY** amiodarone (NEXTERONE PREMIX) 360 MG/200ML (1.8 mg/mL) IV infusion, 60 mg/hr, Intravenous, Continuous, Last Rate: 33.3 mL/hr at 05/20/14 1027, 60 mg/hr at 05/20/14 1027 **FOLLOWED BY** amiodarone (NEXTERONE PREMIX) 360 MG/200ML (1.8 mg/mL) IV infusion, 30 mg/hr, Intravenous, Continuous, Clent Demark, MD .  aspirin EC tablet 81 mg, 81 mg, Oral, Daily, Clent Demark, MD, 81 mg at 05/20/14 1015 .  azithromycin (ZITHROMAX) 500 mg in dextrose 5 % 250 mL IVPB, 500 mg, Intravenous, Q24H, Clent Demark, MD, 500 mg at 05/20/14 0104 .  cefTRIAXone (ROCEPHIN) 1 g  in dextrose 5 % 50 mL IVPB - Premix, 1 g, Intravenous, Q24H, Clent Demark, MD .  feeding supplement (ENSURE COMPLETE) (ENSURE COMPLETE) liquid 237 mL, 237 mL, Oral, BID BM, Clent Demark, MD .  heparin ADULT infusion 100 units/mL (25000 units/250 mL), 1,450 Units/hr, Intravenous, Continuous, Clent Demark, MD, Last Rate: 14.5 mL/hr at 05/20/14 1027, 1,450 Units/hr at 05/20/14 1027 .  heparin bolus via infusion 2,500 Units, 2,500 Units, Intravenous, Once, Clent Demark, MD .  metoprolol tartrate (LOPRESSOR) tablet 25 mg, 25 mg, Oral, BID, Clent Demark, MD, 25 mg at 05/20/14 1015 .  nitroGLYCERIN (NITROSTAT) SL tablet 0.4 mg, 0.4 mg, Sublingual, Q5 min PRN, Clent Demark, MD .  ondansetron (ZOFRAN) injection 4 mg, 4 mg, Intravenous, Q6H PRN, Clent Demark, MD .  pantoprazole (PROTONIX) injection 40 mg, 40 mg, Intravenous, Q24H, Clent Demark, MD, 40 mg at 05/20/14 0133 .  PARoxetine (PAXIL) tablet 20 mg, 20 mg, Oral, Daily, Clent Demark, MD, 20 mg at 05/20/14 1015 Allergies  Allergen Reactions  . Isosorbide Swelling     Objective:     BP 96/79 mmHg  Pulse 92  Temp(Src) 98 F (36.7 C) (Oral)  Resp 18  Ht 5\' 8"  (1.727 m)  Wt 77.3 kg (170 lb 6.7 oz)  BMI 25.92 kg/m2  SpO2 94%  He is in no distress  Nonicteric  Heart irregular rhythm  Lungs clear  Abdomen: Bowel sounds present, soft, liver feels firm and enlarged  Laboratory No components found for: D1    Assessment:     Dysphagia. Rule out distal esophageal mass      Plan:     We will go ahead and plan EGD to further investigate.

## 2014-05-20 NOTE — Consult Note (Signed)
Name: Erik Blake MRN: [id number] DOB: 1951-02-15    ADMISSION DATE:  05/19/2014 CONSULTATION DATE:  05/20/14  REFERRING MD :  Dr. Terrence Dupont   CHIEF COMPLAINT:  SOB   BRIEF PATIENT DESCRIPTION: 64 y/o M, former smoker, admitted with a 60lb weight loss & progressive SOB.  Found to have AFib with RVR and admitted per Cardiology.  CXR was concerning for LLL PNA.  CT of Chest completed and notable for widespread nodular lesions of the lungs concerning for metastatic disease.  PCCM consulted for evaluation.    SIGNIFICANT EVENTS  2/09  Admit with SOB, weight loss, AF with RVR  STUDIES:  2/08  CTA Chest >> no PE, innumerable pulmonary nodular lesions throughout the lungs consistent with widespread metastatic disease.  LLL consolidation.  Questionable endobronchial lesion obstructing the LLL bronchus.  Esophageal dilation with fluid extending from the level of the aortic arch to the GE junction, ? Distal esophageal mass (hx achalasia of distal esophagus).  Small nodular lesion in R lobe of liver  HISTORY OF PRESENT ILLNESS: 64 y/o M, former smoker (quit 02/2014), with a PMH of HTN, HLD, CAD s/p CABG, GERD, and achalasia of distal esophagus who presented to [facility] ER on 2/08 with a 3 week history of progressive shortness of breath, non-productive cough and weight loss.  The patient reports an approximate 60 lb weight loss over the last 3 months.  He also reported lightheadedness / dizziness, decreased energy, loss of interest, difficulty sleeping.    ER work up notable for atrial fibrillation with RVR and a chest xray that was concerning for LLL PNA and pulmonary nodule.  Given cxr abnormalities, a CT of th Chest was completed which was positive for innumerable pulmonary nodular lesions throughout the lungs consistent with widespread metastatic disease.  No PE.  LLL consolidation.  Questionable endobronchial lesion obstructing the LLL bronchus.  Esophageal dilation with fluid extending from the level of the  aortic arch to the GE junction, ? Distal esophageal mass (hx achalasia of distal esophagus).  Small nodular lesion in R lobe of liver.   The patient is currently disabled due to cardiac history.  He previously worked as a Administrator, arts (restoring cars, painting cars).  He was in the [employer] but did not have significant exposures.    PCCM consulted for evaluation of nodular lesions / possible endobronchial lesion.    PAST MEDICAL HISTORY :   has a past medical history of Hypertension (2012); Myocardial infarction (2003); Hyperlipidemia (2012); Achalasia of esophagus; Tobacco abuse; CAD (coronary artery disease); Weight loss (05/2014); and GERD (gastroesophageal reflux disease).  has past surgical history that includes Coronary artery bypass graft (2003, 2015 ); left heart catheterization with coronary angiogram (N/A, 02/28/2012); and percutaneous coronary stent intervention (pci-s) (N/A, 06/05/2012).   HOME MEDICATIONS:  Prior to Admission medications   Medication Sig Start Date End Date Taking? Authorizing Provider  ALPRAZolam Duanne Moron) 0.5 MG tablet Take 0.5 mg by mouth 2 (two) times daily.   Yes Historical Provider, MD  amLODipine (NORVASC) 5 MG tablet Take 5 mg by mouth daily.   Yes Historical Provider, MD  aspirin 81 MG tablet Take 81 mg by mouth daily.   Yes Historical Provider, MD  clopidogrel (PLAVIX) 75 MG tablet Take 75 mg by mouth daily.   Yes Historical Provider, MD  metoprolol succinate (TOPROL-XL) 50 MG 24 hr tablet Take 50 mg by mouth daily. Take with or immediately following a meal.   Yes Historical Provider, MD  nitroGLYCERIN (  NITROSTAT) 0.4 MG SL tablet Place 0.4 mg under the tongue every 5 (five) minutes as needed (for chest pain).    Yes Historical Provider, MD  PARoxetine (PAXIL) 20 MG tablet Take 20 mg by mouth daily.   Yes Historical Provider, MD  simvastatin (ZOCOR) 40 MG tablet Take 40 mg by mouth every morning.    Yes Historical Provider, MD   Allergies  Allergen  Reactions  . Isosorbide Swelling    FAMILY HISTORY:  family history includes Diabetes in his brother, father, mother, and sister; Heart disease in his father and mother. There is no history of Cancer.   SOCIAL HISTORY:  reports that he has been smoking.  He has never used smokeless tobacco. He reports that he does not drink alcohol or use illicit drugs.  REVIEW OF SYSTEMS:   Constitutional: Negative for fever, chills, and diaphoresis. Reports weight loss, malaise/fatigue, inability to sleep HENT: Negative for hearing loss, ear pain, nosebleeds, congestion, sore throat, neck pain, tinnitus and ear discharge.   Eyes: Negative for blurred vision, double vision, photophobia, pain, discharge and redness.  Respiratory: Negative for hemoptysis, sputum production, wheezing and stridor.  Reports cough, SOB Cardiovascular: Negative for chest pain, palpitations, orthopnea, claudication, leg swelling and PND.  Gastrointestinal: Negative for heartburn, nausea, vomiting, abdominal pain, diarrhea, constipation, blood in stool and melena.  Genitourinary: Negative for dysuria, urgency, frequency, hematuria and flank pain.  Musculoskeletal: Negative for myalgias, back pain, joint pain and falls.  Skin: Negative for itching and rash.  Neurological: Negative for dizziness, tingling, tremors, sensory change, speech change, focal weakness, seizures, loss of consciousness, weakness and headaches.  Endo/Heme/Allergies: Negative for environmental allergies and polydipsia. Does not bruise/bleed easily.  SUBJECTIVE:  Reports improvement in SOB.  Denies acute complaints.   VITAL SIGNS: Temp:  [97.7 F (36.5 C)-98.4 F (36.9 C)] 97.9 F (36.6 C) (02/09 0511) Pulse Rate:  [31-170] 101 (02/09 0511) Resp:  [10-28] 15 (02/09 0130) BP: (98-118)/(64-88) 98/69 mmHg (02/09 0511) SpO2:  [91 %-98 %] 94 % (02/09 0511) Weight:  [170 lb 6.7 oz (77.3 kg)-178 lb (80.74 kg)] 170 lb 6.7 oz (77.3 kg) (02/08 2347)  PHYSICAL  EXAMINATION: General:  Thin adult male in NAD3 Neuro:  AAOx4, speech clear, MAE HEENT:  Mm pink/moist, no JVD Cardiovascular:  s1s2 irr irr, no m/r/g Lungs:  resp's even/non-labored, lungs bilaterally clear, diminished LL Abdomen:  NTND, bsx4 active  Ext: no edema. Advanced digital clubbing Skin:  Warm/dry, no edema    Recent Labs Lab 05/19/14 1940 05/20/14 0047  NA 130* 130*  K 3.7 3.8  CL 92* 97  CO2 27 25  BUN 20 17  CREATININE 1.27 0.91  GLUCOSE 145* 105*    Recent Labs Lab 05/19/14 1940 05/20/14 0047 05/20/14 0705  HGB 17.6* 15.1 15.0  HCT 50.2 44.8 44.3  WBC 13.0* 11.6* 11.3*  PLT 203 179 150   Ct Angio Chest Pe W/cm &/or Wo Cm  05/19/2014   CLINICAL DATA:  Shortness of Breath  EXAM: CT ANGIOGRAPHY CHEST WITH CONTRAST  TECHNIQUE: Multidetector CT imaging of the chest was performed using the standard protocol during bolus administration of intravenous contrast. Multiplanar CT image reconstructions and MIPs were obtained to evaluate the vascular anatomy.  CONTRAST:  158mL OMNIPAQUE IOHEXOL 350 MG/ML SOLN  COMPARISON:  Chest radiograph May 19, 2014  FINDINGS: There is no demonstrable pulmonary embolus. There is no appreciable thoracic aortic aneurysm.  There are innumerable small pulmonary nodular lesions throughout the lungs consistent with metastatic foci.  Nodular lesions range in size from as small as 2 mm to as large as 1.8 x 1.7 cm on the right and in the left mid lung measuring 1.6 x 1.3 cm. There is consolidation throughout much of the left lower lobe.  There is an enlarged lymph node in the aortopulmonary window region measuring 2.0 x 1.6 cm. There is adenopathy to the left of the trachea measuring 2.8 by 1.5 cm. There is adenopathy anterior to the carina measuring 1.9 x 1.7 cm. There is sub- carinal adenopathy measuring 3.4 x 1.8 cm. There is probable adenopathy in the left hilum which is indistinguishable from the adjacent airspace consolidation. There may well be  an endobronchial mass in the left lower lobe as well.  There is fluid in wall thickening throughout the esophagus from the level of the aortic arch to the gastroesophageal junction. Proximal to this area, there is distention esophagus with air.  Pericardium is not thickened. Patient is status post coronary artery bypass grafting.  In the visualized upper abdomen, mild ascites is appreciable. There is a nodular lesion in the posterior segment of the right lobe of the liver measuring 1.7 x 1.5 cm.  There are no blastic or lytic bone lesions. There is slight anterior wedging of 2 mid thoracic vertebral bodies. Thyroid appears unremarkable.  Review of the MIP images confirms the above findings.  IMPRESSION: No demonstrable pulmonary embolus.  Innumerable pulmonary nodular lesions throughout the lungs consistent with widespread metastatic disease. There also foci of adenopathy. There is left lower lobe consolidation. There may be an endobronchial lesion obstructing the left lower lobe bronchus.  Generalized esophageal dilatation with fluid extending from the level of the aortic arch to the gastroesophageal junction. This finding could indicate a distal esophageal mass. Direct visualization may be advisable given this appearance.  Small nodular lesion in the right lobe of liver, concerning for metastatic focus given other findings.   Electronically Signed   By: Lowella Grip M.D.   On: 05/19/2014 21:42   Dg Chest Portable 1 View  05/19/2014   CLINICAL DATA:  Cough and shortness of breath for 3 weeks  EXAM: PORTABLE CHEST - 1 VIEW  COMPARISON:  None.  FINDINGS: There is underlying emphysema. There is airspace consolidation in the left lower lobe.  There is a 1.8 x 1.5 cm nodular lesion in the right mid lung.  Heart size is normal. Pulmonary vascularity reflects underlying emphysema. Patient is status post coronary artery bypass grafting. No adenopathy.  IMPRESSION: 1.8 x 1.5 cm nodular lesion right mid lung. Advise  noncontrast enhanced chest CT to further evaluate.  Left lower lobe consolidation.  Underlying emphysema.   Electronically Signed   By: Lowella Grip M.D.   On: 05/19/2014 20:14    ASSESSMENT / PLAN:  Diffuse Bilateral Pulmonary Nodules - concerning for malignancy  Concern for LLL Endobronchial Lesion  LLL PNA   Plan: Continue ABX, Rocephin / Azithro.  D2/x Will likely need FOB for diagnostic sampling and evaluation of airways pending cardiac stabilization Timing of FOB to be determined  Intermittent CXR Pulmonary hygiene   COPD - without acute exacerbation  Former Tobacco Abuse   Plan: PRN albuterol   Possible Liver / Esophageal Lesions - hx of achalasia, fluid level in esophagus Failure to Thrive  Weight Loss   Plan: Ensure BID  Atrial Fibrillation w RVR  CAD s/p CABG  HLD  HTN  Plan: Per Dr. Lucita Lora, NP-C [facility] Pgr: [phone] or [phone]  PCCM ATTENDING: I have reviewed pt's initial presentation, consultants notes and hospital database in detail.  The above assessment and plan was formulated under my direction.  The innumerable pulm nodules almost certainly represents metastatic cancer. The LLL bronchus appears obstructed suggesting that there is endobronchial tumor there which would likely be the primary site. The presence of digital clubbing suggests also that this is a lung primary (most commonly squamous cell tumors cause clubbing).   I think a bronhoscopy has a high diagnostic yield and acceptable risk. If there is endobronchial tumor in the LLL, this will be biopsied. If not, random TBBx should still carry a high yield. I have discussed the indication for FOB with biopsy and the alternatives. Unfortunately, we will not be able to get this done until Thurs 2/11. I have made the necessary arrangements.   PCCM will not see tomorrow (2/10) unless requested  Merton Border, MD;  PCCM service; [place]  [phone]   05/20/2014, 8:52 AM

## 2014-05-20 NOTE — Progress Notes (Signed)
[place] for heparin Indication: atrial fibrillation  Allergies  Allergen Reactions  . Isosorbide Swelling   Labs:  Recent Labs  05/19/14 1940 05/20/14 0047 05/20/14 0705 05/20/14 0850  HGB 17.6* 15.1 15.0  --   HCT 50.2 44.8 44.3  --   PLT 203 179 150  --   APTT  --  73*  --   --   LABPROT  --  15.1  --   --   INR  --  1.18  --   --   HEPARINUNFRC  --   --   --  0.11*  CREATININE 1.27 0.91 0.83  --   TROPONINI <0.03  --   --   --     Estimated Creatinine Clearance: 89.3 mL/min (by C-G formula based on Cr of 0.83).    Assessment: 66 YOM here with 2 week history of SOB that worsened today found to be in AFib with RVR. CHADS2 score = 1  Heparin level low at 0.11.  Goal of Therapy:  Heparin level 0.3-0.7 units/ml Monitor platelets by anticoagulation protocol: Yes   Plan:  Heparin 2500 unit iv bolus x 1 Heparin drip to 1450 units / hr 6 hr heparin level  Thank you. Anette Guarneri, PharmD [phone] -05/20/2014 9:56 AM

## 2014-05-20 NOTE — Progress Notes (Signed)
[place] for heparin Indication: atrial fibrillation  Allergies  Allergen Reactions  . Isosorbide Swelling   Labs:  Recent Labs  05/19/14 1940 05/20/14 0047 05/20/14 0705 05/20/14 0850 05/20/14 1706  HGB 17.6* 15.1 15.0  --   --   HCT 50.2 44.8 44.3  --   --   PLT 203 179 150  --   --   APTT  --  73*  --   --   --   LABPROT  --  15.1  --   --   --   INR  --  1.18  --   --   --   HEPARINUNFRC  --   --   --  0.11* 0.35  CREATININE 1.27 0.91 0.83  --   --   TROPONINI <0.03  --   --   --   --     Estimated Creatinine Clearance: 89.3 mL/min (by C-G formula based on Cr of 0.83).    Assessment: 36 YOM here with 2 week history of SOB that worsened today found to be in AFib with RVR. CHADS2 score = 1  Heparin level low at 0.11. Follow-up heparin level is therapeutic at 0.35 on heparin 1450 units/hr. Heparin will be turned off at 0700 for 1100 EGD. No bleeding noted.  Goal of Therapy:  Heparin level 0.3-0.7 units/ml Monitor platelets by anticoagulation protocol: Yes   Plan:  Continue heparin 1450 units/hr 6h confirmatory HL Daily HL/CBC Monitor s/sx of bleeding F/u restarting heparin after EGD  Andrey Cota. Diona Foley, PharmD Clinical Pharmacist Pager [phone] 05/20/2014 6:49 PM

## 2014-05-20 NOTE — Progress Notes (Signed)
Utilization review completed.  

## 2014-05-20 NOTE — Progress Notes (Signed)
Erik Blake for heparin Indication: atrial fibrillation  Allergies  Allergen Reactions  . Isosorbide Swelling    Patient Measurements: Height: 5\' 8"  (172.7 cm) Weight: 170 lb 6.7 oz (77.3 kg) IBW/kg (Calculated) : 68.4   Vital Signs: Temp: 98.3 F (36.8 C) (02/09 2031) Temp Source: Oral (02/09 2031) BP: 99/79 mmHg (02/09 2031) Pulse Rate: 110 (02/09 2031)  Labs:  Recent Labs  05/19/14 1940 05/20/14 0047 05/20/14 0705 05/20/14 0850 05/20/14 1706 05/20/14 2255  HGB 17.6* 15.1 15.0  --   --   --   HCT 50.2 44.8 44.3  --   --   --   PLT 203 179 150  --   --   --   APTT  --  73*  --   --   --   --   LABPROT  --  15.1  --   --   --   --   INR  --  1.18  --   --   --   --   HEPARINUNFRC  --   --   --  0.11* 0.35 0.35  CREATININE 1.27 0.91 0.83  --   --   --   TROPONINI <0.03  --   --   --   --   --     Estimated Creatinine Clearance: 89.3 mL/min (by C-G formula based on Cr of 0.83).  Assessment: 64 y.o. male with Afib for heparin  Goal of Therapy:  Heparin level 0.3-0.7 units/ml Monitor platelets by anticoagulation protocol: Yes   Plan:  Continue Heparin at current rate  Phillis Knack, PharmD, BCPS

## 2014-05-20 NOTE — Progress Notes (Signed)
Subjective:  Patient denies any chest pains or shortness of breath feels little better cough is improved. Patient just received IV bolus of amiodarone was not started on the drip in ER . Remains in A. fib with moderate went to response  Objective:  Vital Signs in the last 24 hours: Temp:  [97.7 F (36.5 C)-98.4 F (36.9 C)] 97.9 F (36.6 C) (02/09 0511) Pulse Rate:  [31-170] 101 (02/09 0511) Resp:  [10-28] 15 (02/09 0130) BP: (98-118)/(64-88) 98/69 mmHg (02/09 0511) SpO2:  [91 %-98 %] 94 % (02/09 0511) Weight:  [77.3 kg (170 lb 6.7 oz)-80.74 kg (178 lb)] 77.3 kg (170 lb 6.7 oz) (02/08 2347)  Intake/Output from previous day: 02/08 0701 - 02/09 0700 In: -  Out: 200 [Urine:200] Intake/Output from this shift: Total I/O In: 240 [P.O.:240] Out: 100 [Urine:100]  Physical Exam: Neck: no adenopathy, no carotid bruit, no JVD and supple, symmetrical, trachea midline Lungs: Decreased breath sound at bases with left basilar rhonchi noted Heart: irregularly irregular rhythm, S1, S2 normal and Soft systolic murmur noted Abdomen: soft, non-tender; bowel sounds normal; no masses,  no organomegaly Extremities: extremities normal, atraumatic, no cyanosis or edema  Lab Results:  Recent Labs  05/20/14 0047 05/20/14 0705  WBC 11.6* 11.3*  HGB 15.1 15.0  PLT 179 150    Recent Labs  05/20/14 0047 05/20/14 0705  NA 130* 131*  K 3.8 3.7  CL 97 96  CO2 25 20  GLUCOSE 105* 84  BUN 17 17  CREATININE 0.91 0.83    Recent Labs  05/19/14 1940  TROPONINI <0.03   Hepatic Function Panel  Recent Labs  05/20/14 0047  PROT 6.7  ALBUMIN 2.8*  AST 156*  ALT 113*  ALKPHOS 574*  BILITOT 3.1*    Recent Labs  05/20/14 0705  CHOL 168   No results for input(s): PROTIME in the last 72 hours.  Imaging: Imaging results have been reviewed and Ct Angio Chest Pe W/cm &/or Wo Cm  05/19/2014   CLINICAL DATA:  Shortness of Breath  EXAM: CT ANGIOGRAPHY CHEST WITH CONTRAST  TECHNIQUE:  Multidetector CT imaging of the chest was performed using the standard protocol during bolus administration of intravenous contrast. Multiplanar CT image reconstructions and MIPs were obtained to evaluate the vascular anatomy.  CONTRAST:  183mL OMNIPAQUE IOHEXOL 350 MG/ML SOLN  COMPARISON:  Chest radiograph May 19, 2014  FINDINGS: There is no demonstrable pulmonary embolus. There is no appreciable thoracic aortic aneurysm.  There are innumerable small pulmonary nodular lesions throughout the lungs consistent with metastatic foci. Nodular lesions range in size from as small as 2 mm to as large as 1.8 x 1.7 cm on the right and in the left mid lung measuring 1.6 x 1.3 cm. There is consolidation throughout much of the left lower lobe.  There is an enlarged lymph node in the aortopulmonary window region measuring 2.0 x 1.6 cm. There is adenopathy to the left of the trachea measuring 2.8 by 1.5 cm. There is adenopathy anterior to the carina measuring 1.9 x 1.7 cm. There is sub- carinal adenopathy measuring 3.4 x 1.8 cm. There is probable adenopathy in the left hilum which is indistinguishable from the adjacent airspace consolidation. There may well be an endobronchial mass in the left lower lobe as well.  There is fluid in wall thickening throughout the esophagus from the level of the aortic arch to the gastroesophageal junction. Proximal to this area, there is distention esophagus with air.  Pericardium is not thickened.  Patient is status post coronary artery bypass grafting.  In the visualized upper abdomen, mild ascites is appreciable. There is a nodular lesion in the posterior segment of the right lobe of the liver measuring 1.7 x 1.5 cm.  There are no blastic or lytic bone lesions. There is slight anterior wedging of 2 mid thoracic vertebral bodies. Thyroid appears unremarkable.  Review of the MIP images confirms the above findings.  IMPRESSION: No demonstrable pulmonary embolus.  Innumerable pulmonary nodular  lesions throughout the lungs consistent with widespread metastatic disease. There also foci of adenopathy. There is left lower lobe consolidation. There may be an endobronchial lesion obstructing the left lower lobe bronchus.  Generalized esophageal dilatation with fluid extending from the level of the aortic arch to the gastroesophageal junction. This finding could indicate a distal esophageal mass. Direct visualization may be advisable given this appearance.  Small nodular lesion in the right lobe of liver, concerning for metastatic focus given other findings.   Electronically Signed   By: Lowella Grip M.D.   On: 05/19/2014 21:42   Dg Chest Portable 1 View  05/19/2014   CLINICAL DATA:  Cough and shortness of breath for 3 weeks  EXAM: PORTABLE CHEST - 1 VIEW  COMPARISON:  None.  FINDINGS: There is underlying emphysema. There is airspace consolidation in the left lower lobe.  There is a 1.8 x 1.5 cm nodular lesion in the right mid lung.  Heart size is normal. Pulmonary vascularity reflects underlying emphysema. Patient is status post coronary artery bypass grafting. No adenopathy.  IMPRESSION: 1.8 x 1.5 cm nodular lesion right mid lung. Advise noncontrast enhanced chest CT to further evaluate.  Left lower lobe consolidation.  Underlying emphysema.   Electronically Signed   By: Lowella Grip M.D.   On: 05/19/2014 20:14    Cardiac Studies:  Assessment/Plan:  Atypical chest pain New-onset A. fib with RVR Left lower lobe pneumonia Metastatic lung disease of unknown primary Achalasia distal esophagus with generalized esophageal dilation rule out esophageal mass Coronary artery disease history of MI 3 in the past status post CABG status post PCI to protected left main and left circumflex approximately 2 years ago Hypertension Prediabetic History of Tobacco abuse approximately 50 pack years Significant weight loss and cachexia Depression Positive family history of coronary artery  disease Plan As per orders Check 2-D echo   LOS: 1 day    Erik Blake 05/20/2014, 9:44 AM

## 2014-05-21 ENCOUNTER — Inpatient Hospital Stay (HOSPITAL_COMMUNITY): Payer: Medicaid Other

## 2014-05-21 ENCOUNTER — Encounter (HOSPITAL_COMMUNITY): Payer: Self-pay | Admitting: Critical Care Medicine

## 2014-05-21 ENCOUNTER — Encounter (HOSPITAL_COMMUNITY)
Admission: EM | Discharge status: Against Medical Advice | Payer: Self-pay | Source: Home / Self Care | Attending: Cardiology

## 2014-05-21 ENCOUNTER — Inpatient Hospital Stay (HOSPITAL_COMMUNITY): Payer: Medicaid Other | Admitting: Critical Care Medicine

## 2014-05-21 HISTORY — PX: ESOPHAGOGASTRODUODENOSCOPY: SHX5428

## 2014-05-21 LAB — CBC
HEMATOCRIT: 42.9 % (ref 39.0–52.0)
Hemoglobin: 14.8 g/dL (ref 13.0–17.0)
MCH: 30 pg (ref 26.0–34.0)
MCHC: 34.5 g/dL (ref 30.0–36.0)
MCV: 87 fL (ref 78.0–100.0)
Platelets: 141 10*3/uL — ABNORMAL LOW (ref 150–400)
RBC: 4.93 MIL/uL (ref 4.22–5.81)
RDW: 16.3 % — AB (ref 11.5–15.5)
WBC: 9.9 10*3/uL (ref 4.0–10.5)

## 2014-05-21 LAB — HIV ANTIBODY (ROUTINE TESTING W REFLEX): HIV Screen 4th Generation wRfx: NONREACTIVE

## 2014-05-21 LAB — HEPARIN LEVEL (UNFRACTIONATED): Heparin Unfractionated: <0.1 IU/mL — ABNORMAL LOW (ref 0.30–0.70)

## 2014-05-21 SURGERY — EGD (ESOPHAGOGASTRODUODENOSCOPY)
Anesthesia: Monitor Anesthesia Care

## 2014-05-21 MED ORDER — IOHEXOL 300 MG/ML  SOLN
80.0000 mL | Freq: Once | INTRAMUSCULAR | Status: AC | PRN
  Administered 2014-05-21: 80 mL via INTRAVENOUS

## 2014-05-21 MED ORDER — LIDOCAINE HCL 2 % EX GEL
1.0000 "application " | Freq: Once | CUTANEOUS | Status: DC
  Filled 2014-05-21: qty 5

## 2014-05-21 MED ORDER — PHENYLEPHRINE HCL 0.25 % NA SOLN
1.0000 | Freq: Four times a day (QID) | NASAL | Status: DC | PRN
  Filled 2014-05-21: qty 15

## 2014-05-21 MED ORDER — GADOBENATE DIMEGLUMINE 529 MG/ML IV SOLN
15.0000 mL | Freq: Once | INTRAVENOUS | Status: AC | PRN
  Administered 2014-05-21: 15 mL via INTRAVENOUS

## 2014-05-21 MED ORDER — MIDAZOLAM HCL 5 MG/5ML IJ SOLN
INTRAMUSCULAR | Status: DC | PRN
  Administered 2014-05-21 (×2): 1 mg via INTRAVENOUS

## 2014-05-21 MED ORDER — DIGOXIN 0.25 MG/ML IJ SOLN
0.2500 mg | Freq: Every day | INTRAMUSCULAR | Status: DC
  Administered 2014-05-21 – 2014-05-23 (×3): 0.25 mg via INTRAVENOUS
  Filled 2014-05-21 (×4): qty 1

## 2014-05-21 MED ORDER — IOHEXOL 300 MG/ML  SOLN: 25.0000 mL | INTRAMUSCULAR | Status: AC

## 2014-05-21 MED ORDER — HEPARIN (PORCINE) IN NACL 100-0.45 UNIT/ML-% IJ SOLN
1500.0000 [IU]/h | INTRAMUSCULAR | Status: AC
  Administered 2014-05-21: 1450 [IU]/h via INTRAVENOUS
  Filled 2014-05-21 (×2): qty 250

## 2014-05-21 MED ORDER — ONDANSETRON HCL 4 MG/2ML IJ SOLN
INTRAMUSCULAR | Status: DC | PRN
  Administered 2014-05-21: 4 mg via INTRAVENOUS

## 2014-05-21 MED ORDER — PROPOFOL INFUSION 10 MG/ML OPTIME
INTRAVENOUS | Status: DC | PRN
  Administered 2014-05-21: 100 ug/kg/min via INTRAVENOUS

## 2014-05-21 MED ORDER — BUTAMBEN-TETRACAINE-BENZOCAINE 2-2-14 % EX AERO
1.0000 | INHALATION_SPRAY | Freq: Once | CUTANEOUS | Status: DC
  Filled 2014-05-21: qty 20

## 2014-05-21 MED ORDER — PHENYLEPHRINE HCL 10 MG/ML IJ SOLN
INTRAMUSCULAR | Status: DC | PRN
  Administered 2014-05-21 (×4): 80 ug via INTRAVENOUS

## 2014-05-21 NOTE — Transfer of Care (Signed)
Immediate Anesthesia Transfer of Care Note  Patient: Erik Blake  Procedure(s) Performed: Procedure(s): ESOPHAGOGASTRODUODENOSCOPY (EGD) (N/A)  Patient Location: Endoscopy Unit  Anesthesia Type:MAC  Level of Consciousness: sedated  Airway & Oxygen Therapy: Patient Spontanous Breathing and Patient connected to nasal cannula oxygen  Post-op Assessment: Report given to RN, Post -op Vital signs reviewed and stable and Patient moving all extremities X 4  Post vital signs: Reviewed and stable  Last Vitals:  Filed Vitals:   05/21/14 0957  BP: 105/76  Pulse: 120  Temp: 36.5 C  Resp: 17    Complications: No apparent anesthesia complications

## 2014-05-21 NOTE — Progress Notes (Signed)
[place] for heparin Indication: atrial fibrillation  Allergies  Allergen Reactions  . Isosorbide Swelling    Patient Measurements: Height: 5\' 8"  (172.7 cm) Weight: 170 lb 6.7 oz (77.3 kg) IBW/kg (Calculated) : 68.4   Vital Signs: Temp: 97.9 F (36.6 C) (02/10 2115) Temp Source: Oral (02/10 2115) BP: 93/68 mmHg (02/10 2115) Pulse Rate: 86 (02/10 2115)  Labs:  Recent Labs  05/19/14 1940 05/20/14 0047 05/20/14 0705  05/20/14 1706 05/20/14 2255 05/21/14 0517 05/21/14 2125  HGB 17.6* 15.1 15.0  --   --   --  14.8  --   HCT 50.2 44.8 44.3  --   --   --  42.9  --   PLT 203 179 150  --   --   --  141*  --   APTT  --  73*  --   --   --   --   --   --   LABPROT  --  15.1  --   --   --   --   --   --   INR  --  1.18  --   --   --   --   --   --   HEPARINUNFRC  --   --   --   < > 0.35 0.35  --  <0.10*  CREATININE 1.27 0.91 0.83  --   --   --   --   --   TROPONINI <0.03  --   --   --   --   --   --   --   < > = values in this interval not displayed.  Estimated Creatinine Clearance: 89.3 mL/min (by C-G formula based on Cr of 0.83).  Assessment: 64 year old male resuming heparin for Afib after EGD To be held at 6 am 2/11 for bronchoscopy  Follow-up HL is subtherapeutic and undetectable. RN reports that heparin was turned off while pt was off floor and then restarted at 2053. Will increase slightly and drip will be stopped at 0600 2/11.  Goal of Therapy:  Heparin level 0.3-0.7 units/ml Monitor platelets by anticoagulation protocol: Yes   Plan:  Increase heparin to 1500 units/hr Daily HL/CBC Monitor s/sx of bleeding  Andrey Cota. Diona Foley, PharmD Clinical Pharmacist Pager [phone]

## 2014-05-21 NOTE — Progress Notes (Signed)
Subjective: Appreciate all consultants hELP. Denies any chest pain or shortness of breath.  Upper endoscopy results noted.  Patient remains in A. Fib with moderate ventricular response  Objective:  Vital Signs in the last 24 hours: Temp:  [97.7 F (36.5 C)-98.3 F (36.8 C)] 97.7 F (36.5 C) (02/10 0957) Pulse Rate:  [71-120] 91 (02/10 1149) Resp:  [12-27] 27 (02/10 1149) BP: (97-105)/(72-82) 105/82 mmHg (02/10 1149) SpO2:  [94 %-98 %] 95 % (02/10 1149)  Intake/Output from previous day: 02/09 0701 - 02/10 0700 In: 600 [P.O.:600] Out: 200 [Urine:200] Intake/Output from this shift: Total I/O In: 150 [I.V.:150] Out: 100 [Urine:100]  Physical Exam: Neck: no adenopathy, no carotid bruit, no JVD and supple, symmetrical, trachea midline Lungs: DECREASED BREATH SOUNDS AT LEFT BASE WITH RHONCHI Heart: irregularly irregular rhythm, S1, S2 normal and SOFT SYSTOLIC MURMUR NOTED Abdomen: soft, non-tender; bowel sounds normal; no masses,  no organomegaly Extremities: extremities normal, atraumatic, no cyanosis or edema  Lab Results:  Recent Labs  05/20/14 0705 05/21/14 0517  WBC 11.3* 9.9  HGB 15.0 14.8  PLT 150 141*    Recent Labs  05/20/14 0047 05/20/14 0705  NA 130* 131*  K 3.8 3.7  CL 97 96  CO2 25 20  GLUCOSE 105* 84  BUN 17 17  CREATININE 0.91 0.83    Recent Labs  05/19/14 1940  TROPONINI <0.03   Hepatic Function Panel  Recent Labs  05/20/14 0047  PROT 6.7  ALBUMIN 2.8*  AST 156*  ALT 113*  ALKPHOS 574*  BILITOT 3.1*    Recent Labs  05/20/14 0705  CHOL 168   No results for input(s): PROTIME in the last 72 hours.  Imaging: Imaging results have been reviewed and Ct Angio Chest Pe W/cm &/or Wo Cm  05/19/2014   CLINICAL DATA:  Shortness of Breath  EXAM: CT ANGIOGRAPHY CHEST WITH CONTRAST  TECHNIQUE: Multidetector CT imaging of the chest was performed using the standard protocol during bolus administration of intravenous contrast. Multiplanar CT image  reconstructions and MIPs were obtained to evaluate the vascular anatomy.  CONTRAST:  15mL OMNIPAQUE IOHEXOL 350 MG/ML SOLN  COMPARISON:  Chest radiograph May 19, 2014  FINDINGS: There is no demonstrable pulmonary embolus. There is no appreciable thoracic aortic aneurysm.  There are innumerable small pulmonary nodular lesions throughout the lungs consistent with metastatic foci. Nodular lesions range in size from as small as 2 mm to as large as 1.8 x 1.7 cm on the right and in the left mid lung measuring 1.6 x 1.3 cm. There is consolidation throughout much of the left lower lobe.  There is an enlarged lymph node in the aortopulmonary window region measuring 2.0 x 1.6 cm. There is adenopathy to the left of the trachea measuring 2.8 by 1.5 cm. There is adenopathy anterior to the carina measuring 1.9 x 1.7 cm. There is sub- carinal adenopathy measuring 3.4 x 1.8 cm. There is probable adenopathy in the left hilum which is indistinguishable from the adjacent airspace consolidation. There may well be an endobronchial mass in the left lower lobe as well.  There is fluid in wall thickening throughout the esophagus from the level of the aortic arch to the gastroesophageal junction. Proximal to this area, there is distention esophagus with air.  Pericardium is not thickened. Patient is status post coronary artery bypass grafting.  In the visualized upper abdomen, mild ascites is appreciable. There is a nodular lesion in the posterior segment of the right lobe of the liver measuring  1.7 x 1.5 cm.  There are no blastic or lytic bone lesions. There is slight anterior wedging of 2 mid thoracic vertebral bodies. Thyroid appears unremarkable.  Review of the MIP images confirms the above findings.  IMPRESSION: No demonstrable pulmonary embolus.  Innumerable pulmonary nodular lesions throughout the lungs consistent with widespread metastatic disease. There also foci of adenopathy. There is left lower lobe consolidation. There may  be an endobronchial lesion obstructing the left lower lobe bronchus.  Generalized esophageal dilatation with fluid extending from the level of the aortic arch to the gastroesophageal junction. This finding could indicate a distal esophageal mass. Direct visualization may be advisable given this appearance.  Small nodular lesion in the right lobe of liver, concerning for metastatic focus given other findings.   Electronically Signed   By: Lowella Grip M.D.   On: 05/19/2014 21:42   Dg Chest Portable 1 View  05/19/2014   CLINICAL DATA:  Cough and shortness of breath for 3 weeks  EXAM: PORTABLE CHEST - 1 VIEW  COMPARISON:  None.  FINDINGS: There is underlying emphysema. There is airspace consolidation in the left lower lobe.  There is a 1.8 x 1.5 cm nodular lesion in the right mid lung.  Heart size is normal. Pulmonary vascularity reflects underlying emphysema. Patient is status post coronary artery bypass grafting. No adenopathy.  IMPRESSION: 1.8 x 1.5 cm nodular lesion right mid lung. Advise noncontrast enhanced chest CT to further evaluate.  Left lower lobe consolidation.  Underlying emphysema.   Electronically Signed   By: Lowella Grip M.D.   On: 05/19/2014 20:14    Cardiac Studies:  Assessment/Plan:  Atypical chest pain New-onset A. 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

## 2014-05-21 NOTE — Progress Notes (Signed)
Met with patient, wife Cecille Rubin; oriented them to the possibility of metastatic cancer; discussed possible treatments including chemo and/or palliative radiation to GEJ if needed so he can swallow. They understand in general treatment of metastatic carcinoma is not curative but can lengthen life and improve quality of life.  If patient is discharged before we have a definitive diagnosis I will make sure he has a follow-up with [place] to discuss definitive treatment plans  A brain MRI and abd/pelvic CT (or at least abd [place]) prior to discharge would be hepful to complete staging.  Will follow with you.

## 2014-05-21 NOTE — Anesthesia Preprocedure Evaluation (Addendum)
Anesthesia Evaluation  Patient identified by MRN, date of birth, ID band Patient awake    Reviewed: Allergy & Precautions, NPO status , Patient's Chart, lab work & pertinent test results, reviewed documented beta blocker date and time   Airway Mallampati: I  TM Distance: >3 FB Neck ROM: Full    Dental  (+) Dental Advisory Given   Pulmonary Current Smoker,          Cardiovascular hypertension, Pt. on medications and Pt. on home beta blockers + CAD and + Past MI     Neuro/Psych    GI/Hepatic GERD-  Medicated and Controlled,  Endo/Other    Renal/GU      Musculoskeletal   Abdominal   Peds  Hematology   Anesthesia Other Findings   Reproductive/Obstetrics                            Anesthesia Physical Anesthesia Plan  ASA: III  Anesthesia Plan: MAC   Post-op Pain Management:    Induction: Intravenous  Airway Management Planned: Nasal Cannula  Additional Equipment:   Intra-op Plan:   Post-operative Plan:   Informed Consent: I have reviewed the patients History and Physical, chart, labs and discussed the procedure including the risks, benefits and alternatives for the proposed anesthesia with the patient or authorized representative who has indicated his/her understanding and acceptance.   Dental advisory given  Plan Discussed with: Surgeon and CRNA  Anesthesia Plan Comments: (Spoke with Dr Terrence Dupont, Cardiologyu who recommended Lopressor for tachycardia and has not read todays echo but previous recent echo was EF 50%. OK to proceed with MAC.)       Anesthesia Quick Evaluation

## 2014-05-21 NOTE — Anesthesia Postprocedure Evaluation (Signed)
Anesthesia Post Note  Patient: Erik Blake  Procedure(s) Performed: Procedure(s) (LRB): ESOPHAGOGASTRODUODENOSCOPY (EGD) (N/A)  Anesthesia type: general  Patient location: PACU  Post pain: Pain level controlled  Post assessment: Patient's Cardiovascular Status Stable  Last Vitals:  Filed Vitals:   05/21/14 1430  BP: 107/79  Pulse: 119  Temp: 36.6 C  Resp: 20    Post vital signs: Reviewed and stable  Level of consciousness: sedated  Complications: No apparent anesthesia complications

## 2014-05-21 NOTE — Op Note (Signed)
[facility] [place], [address]   ENDOSCOPY PROCEDURE REPORT  PATIENT: Erik, Blake  MR#: [id number] BIRTHDATE: 1950/08/17 , 24  yrs. old GENDER: male ENDOSCOPIST: Acquanetta Sit, MD REFERRED BY: PROCEDURE DATE:  05/22/14 PROCEDURE:  EGD ASA CLASS:     3 INDICATIONS:  dysphagia, rule out distal esophageal tumor MEDICATIONS: sedation per anesthesia TOPICAL ANESTHETIC:  DESCRIPTION OF PROCEDURE: After the risks benefits and alternatives of the procedure were thoroughly explained, informed consent was obtained.  The Pentax Gastroscope [id number] endoscope was introduced through the mouth and advanced to the second portion of the duodenum , Without limitations.  The instrument was slowly withdrawn as the mucosa was fully examined.  Findings:  Esophagus: The esophagogastric junction was at 41 cm from the incisor teeth region. There was initially a little resistance to the passage of the scope into the stomach but this seem mostly like spasm. The esophagogastric junction is not strictured and there is no evidence of tumor at this level or in the esophagus or fundus or cardia. The pictures below show normal appearance of these areas.  Stomach: Mild erythema in the antrum, otherwise the stomach looked normal.  Duodenum: Normal    The scope was then withdrawn from the patient and the procedure completed.  COMPLICATIONS: There were no immediate complications.  ENDOSCOPIC IMPRESSION:see above.   RECOMMENDATIONS:advance diet. We will sign off. Call if needed.   REPEAT EXAM:  eSignedAcquanetta Sit, MD May 22, 2014 11:37 AM    CC:  CPT CODES: ICD CODES:  The ICD and CPT codes recommended by this software are interpretations from the data that the clinical staff has captured with the software.  The verification of the translation of this report to the ICD and CPT codes and modifiers is the sole responsibility of the health care  institution and practicing physician where this report was generated.  [employer]. will not be held responsible for the validity of the ICD and CPT codes included on this report.  AMA assumes no liability for data contained or not contained herein. CPT is a Designer, television/film set of the [employer].

## 2014-05-21 NOTE — Progress Notes (Signed)
[place] for heparin Indication: atrial fibrillation  Allergies  Allergen Reactions  . Isosorbide Swelling    Patient Measurements: Height: 5\' 8"  (172.7 cm) Weight: 170 lb 6.7 oz (77.3 kg) IBW/kg (Calculated) : 68.4   Vital Signs: Temp: 97.7 F (36.5 C) (02/10 0957) Temp Source: Oral (02/10 1139) BP: 105/82 mmHg (02/10 1149) Pulse Rate: 91 (02/10 1149)  Labs:  Recent Labs  05/19/14 1940 05/20/14 0047 05/20/14 0705 05/20/14 0850 05/20/14 1706 05/20/14 2255 05/21/14 0517  HGB 17.6* 15.1 15.0  --   --   --  14.8  HCT 50.2 44.8 44.3  --   --   --  42.9  PLT 203 179 150  --   --   --  141*  APTT  --  73*  --   --   --   --   --   LABPROT  --  15.1  --   --   --   --   --   INR  --  1.18  --   --   --   --   --   HEPARINUNFRC  --   --   --  0.11* 0.35 0.35  --   CREATININE 1.27 0.91 0.83  --   --   --   --   TROPONINI <0.03  --   --   --   --   --   --     Estimated Creatinine Clearance: 89.3 mL/min (by C-G formula based on Cr of 0.83).  Assessment: 64 year old male resuming heparin for Afib after EGD To be held at 6 am 2/11 for bronchoscopy   Goal of Therapy:  Heparin level 0.3-0.7 units/ml Monitor platelets by anticoagulation protocol: Yes   Plan:  Heparin at 1450 units / hr 8 hr heparin level  Thank you. Anette Guarneri, PharmD [phone]

## 2014-05-21 NOTE — Clinical Documentation Improvement (Signed)
Current chart reflects malnutrition in MD progress note; Registered Dietician note 05/20/14 states pt meets severe malnutrition in the context of chronic illness (27.7% weight loss x 1 year, <75% of estimated energy intake x 1 month). If you agree with Registered Dietician's assessment, please document in your progress note and carry over to the discharge summary.  Possible Clinical Conditions: -severe malnutrition in context of chronic illness (as per RD) -Other severity and cause of malnutrition (please specify) -Unable to determine at present  Thank you, Mateo Flow, RN [phone] Clinical Documentation Specialist

## 2014-05-22 ENCOUNTER — Inpatient Hospital Stay (HOSPITAL_COMMUNITY)
Admission: EM | Admit: 2014-05-22 | Discharge: 2014-05-22 | Disposition: A | Discharge status: Home / Self Care | Payer: Medicaid Other | Source: Home / Self Care | Attending: Cardiology | Admitting: Cardiology

## 2014-05-22 ENCOUNTER — Inpatient Hospital Stay (HOSPITAL_COMMUNITY): Payer: Medicaid Other

## 2014-05-22 ENCOUNTER — Encounter (HOSPITAL_COMMUNITY)
Admission: EM | Discharge status: Against Medical Advice | Payer: Self-pay | Source: Home / Self Care | Attending: Cardiology

## 2014-05-22 ENCOUNTER — Encounter (HOSPITAL_COMMUNITY): Payer: Self-pay | Admitting: Gastroenterology

## 2014-05-22 HISTORY — PX: VIDEO BRONCHOSCOPY: SHX5072

## 2014-05-22 LAB — LEGIONELLA ANTIGEN, URINE

## 2014-05-22 LAB — CBC
HEMATOCRIT: 44.6 % (ref 39.0–52.0)
HEMOGLOBIN: 15 g/dL (ref 13.0–17.0)
MCH: 29.8 pg (ref 26.0–34.0)
MCHC: 33.6 g/dL (ref 30.0–36.0)
MCV: 88.5 fL (ref 78.0–100.0)
Platelets: 140 10*3/uL — ABNORMAL LOW (ref 150–400)
RBC: 5.04 MIL/uL (ref 4.22–5.81)
RDW: 16.8 % — ABNORMAL HIGH (ref 11.5–15.5)
WBC: 10.2 10*3/uL (ref 4.0–10.5)

## 2014-05-22 LAB — HEPARIN LEVEL (UNFRACTIONATED)
HEPARIN UNFRACTIONATED: 0.26 [IU]/mL — AB (ref 0.30–0.70)
Heparin Unfractionated: 0.21 IU/mL — ABNORMAL LOW (ref 0.30–0.70)

## 2014-05-22 LAB — CEA: CEA: 1172 ng/mL — AB (ref 0.0–4.7)

## 2014-05-22 LAB — CANCER ANTIGEN 19-9: CA 19-9: 155 U/mL — ABNORMAL HIGH (ref 0–35)

## 2014-05-22 SURGERY — BRONCHOSCOPY, WITH FLUOROSCOPY
Anesthesia: Moderate Sedation

## 2014-05-22 MED ORDER — PHENYLEPHRINE HCL 0.25 % NA SOLN
NASAL | Status: DC | PRN
  Administered 2014-05-22: 2 via NASAL

## 2014-05-22 MED ORDER — MIDAZOLAM HCL 5 MG/ML IJ SOLN
INTRAMUSCULAR | Status: DC | PRN
  Administered 2014-05-22: 4 mg via INTRAVENOUS

## 2014-05-22 MED ORDER — MIDAZOLAM HCL 5 MG/ML IJ SOLN
INTRAMUSCULAR | Status: AC
  Filled 2014-05-22: qty 2

## 2014-05-22 MED ORDER — LEVOFLOXACIN 500 MG PO TABS
500.0000 mg | ORAL_TABLET | Freq: Every day | ORAL | Status: DC
  Administered 2014-05-23: 500 mg via ORAL
  Filled 2014-05-22: qty 1

## 2014-05-22 MED ORDER — FENTANYL CITRATE 0.05 MG/ML IJ SOLN
INTRAMUSCULAR | Status: AC
  Filled 2014-05-22: qty 4

## 2014-05-22 MED ORDER — LIDOCAINE HCL 2 % EX GEL
CUTANEOUS | Status: DC | PRN
  Administered 2014-05-22: 1

## 2014-05-22 MED ORDER — HEPARIN (PORCINE) IN NACL 100-0.45 UNIT/ML-% IJ SOLN
1800.0000 [IU]/h | INTRAMUSCULAR | Status: DC
  Filled 2014-05-22 (×2): qty 250

## 2014-05-22 MED ORDER — FENTANYL CITRATE 0.05 MG/ML IJ SOLN
INTRAMUSCULAR | Status: DC | PRN
  Administered 2014-05-22: 50 ug via INTRAVENOUS

## 2014-05-22 MED ORDER — LIDOCAINE HCL (PF) 1 % IJ SOLN
INTRAMUSCULAR | Status: DC | PRN
  Administered 2014-05-22: 6 mL

## 2014-05-22 MED ORDER — HEPARIN (PORCINE) IN NACL 100-0.45 UNIT/ML-% IJ SOLN
1500.0000 [IU]/h | INTRAMUSCULAR | Status: DC
  Administered 2014-05-22 (×2): 1500 [IU]/h via INTRAVENOUS
  Filled 2014-05-22 (×2): qty 250

## 2014-05-22 NOTE — Progress Notes (Signed)
Paged Dr. Alva Garnet to f/u with Heparin gtt. Pharmacy placed order to restart heparin gtt at 1515. Per Dr. Alva Garnet note needs to be restarted at 12 [place]. Discussed with Dr. Alva Garnet that is ok to have started Heparin gtt but if obvious signs of new blood are noted, NSG needs to page [facility]. Will continue to monitor pt closely.

## 2014-05-22 NOTE — Progress Notes (Signed)
[place] for heparin Indication: atrial fibrillation  Allergies  Allergen Reactions  . Isosorbide Swelling    Patient Measurements: Height: 5\' 8"  (172.7 cm) Weight: 170 lb 6.7 oz (77.3 kg) IBW/kg (Calculated) : 68.4   Vital Signs: Temp: 97.7 F (36.5 C) (02/11 0517) Temp Source: Oral (02/11 0517) BP: 128/100 mmHg (02/11 1435) Pulse Rate: 127 (02/11 1435)  Labs:  Recent Labs  05/19/14 1940 05/20/14 0047 05/20/14 0705  05/20/14 2255 05/21/14 0517 05/21/14 2125 05/22/14 0557  HGB 17.6* 15.1 15.0  --   --  14.8  --  15.0  HCT 50.2 44.8 44.3  --   --  42.9  --  44.6  PLT 203 179 150  --   --  141*  --  140*  APTT  --  73*  --   --   --   --   --   --   LABPROT  --  15.1  --   --   --   --   --   --   INR  --  1.18  --   --   --   --   --   --   HEPARINUNFRC  --   --   --   < > 0.35  --  <0.10* 0.26*  CREATININE 1.27 0.91 0.83  --   --   --   --   --   TROPONINI <0.03  --   --   --   --   --   --   --   < > = values in this interval not displayed.  Estimated Creatinine Clearance: 89.3 mL/min (by C-G formula based on Cr of 0.83).  Assessment: 64 year old male resuming heparin for Afib after EGD. Resuming heaprin after 2/11 bronchoscopy  Goal of Therapy:  Heparin level 0.3-0.7 units/ml Monitor platelets by anticoagulation protocol: Yes   Plan:  Resart  heparin at 1500 units/hr Check level in 6 hours Daily HL/CBC Monitor s/sx of bleeding  Isac Sarna, BS Pharm D, BCPS Clinical Pharmacist

## 2014-05-22 NOTE — Op Note (Signed)
Indication:   LLL mass, multiple bilateral pulmonary nodules - R/O malignancy   Sedation:  Midaz 4 mg Fent 50 mcg  Anesthesia: Topical to nose and throat 40 cc 1% lidocaine used during the procedure  Procedure: After adequate sedation and anesthesia, the bronchoscope was introduced via the R naris and advanced into the posterior pharynx. Further anesthesia was obtained with 1% lidocaine and the scope was advanced into the trachea. Complete airway anesthesia was achieved with 1% lidocaine and a thorough airway examination was performed. This revealed the following.  Findings:  LLL bronchus obstructed in an extrinsic manner with extensive mucosal infiltration in the LLL bronchus Mild bleeding after biopsies resolved spontaneously  Specimens:   TBNA LLL EBBx LLL TBBx LLL (under flouro guidance) Cytology brushings LLL washings  Post procedure evaluation:  The patient tolerated the procedure well with no major complications The chest was scanned with flouro post procedure and there was no evidence of PTX  Resume heparin @ MN  Merton Border, MD;  PCCM service; Mobile [phone]

## 2014-05-22 NOTE — Progress Notes (Signed)
RN received call from Pharm related to pt Heparin level being low. New orders were given to increase Heparin level to 16.5. Dr. Lamonte Sakai was paged to confirm that it was ok to increase level even with pt having blood-tinged sputum. Dr. Lamonte Sakai informed RN it is ok to increase level. RN followed orders. Will cont to monitor.

## 2014-05-22 NOTE — Progress Notes (Signed)
[facility] Progress Note Patient Name: Erik Blake DOB: [birth year] MRN: [id number]   Date of Service  05/22/2014  HPI/Events of Note  Notified that heparin level low, wanted to insure stable to increase to goal. He is having blood-tinged / streaked sputum but no increase noted.   eICU Interventions  Safe to increase to goal heparin rate, will continue to follow, decrease or stop if hemoptysis increases     Intervention Category Minor Interventions: Other:  BYRUM,ROBERT S. 05/22/2014, 11:13 PM

## 2014-05-22 NOTE — Progress Notes (Signed)
[place] for heparin Indication: atrial fibrillation  Allergies  Allergen Reactions  . Isosorbide Swelling    Patient Measurements: Height: 5\' 8"  (172.7 cm) Weight: 170 lb 6.7 oz (77.3 kg) IBW/kg (Calculated) : 68.4   Vital Signs: Temp: 97.3 F (36.3 C) (02/11 2038) Temp Source: Oral (02/11 2038) BP: 98/78 mmHg (02/11 2107) Pulse Rate: 100 (02/11 2107)  Labs:  Recent Labs  05/20/14 0047 05/20/14 0705  05/21/14 0517 05/21/14 2125 05/22/14 0557 05/22/14 2138  HGB 15.1 15.0  --  14.8  --  15.0  --   HCT 44.8 44.3  --  42.9  --  44.6  --   PLT 179 150  --  141*  --  140*  --   APTT 73*  --   --   --   --   --   --   LABPROT 15.1  --   --   --   --   --   --   INR 1.18  --   --   --   --   --   --   HEPARINUNFRC  --   --   < >  --  <0.10* 0.26* 0.21*  CREATININE 0.91 0.83  --   --   --   --   --   < > = values in this interval not displayed.  Estimated Creatinine Clearance: 89.3 mL/min (by C-G formula based on Cr of 0.83).  Assessment: 64 year old male resuming heparin for Afib after EGD. Resuming heaprin after 2/11 bronchoscopy.  Heparin level slightly subtherapeutic on 1500 units/hr.  Heparin was resumed at 1500 PM.  Per RN having some blood-tinged sputum.  Goal of Therapy:  Heparin level 0.3-0.7 units/ml Monitor platelets by anticoagulation protocol: Yes   Plan:  Increase IV heparin to 1650 units/hr. Recheck heparin level with AM labs.  Uvaldo Rising, BCPS  Clinical Pharmacist Pager [phone]  05/22/2014 10:35 PM

## 2014-05-22 NOTE — Progress Notes (Signed)
Video Bronchoscopy Performed  Brushing Intervention done Biopsy Intervention done Needle Biopsy Intervention done  Bronchial Washing Intervention done  Pt tolerated well   Merton Border, MD ; [facility] service [place] [phone].  After 5:30 PM or weekends, call [phone]

## 2014-05-22 NOTE — Progress Notes (Signed)
Subjective:  Patient complains of generalized weakness and occasional choking sensation. Schedule for fiberoptic bronchoscopy today. Results of CT of abdomen and MRI of brain noted  Objective:  Vital Signs in the last 24 hours: Temp:  [97.7 F (36.5 C)-97.9 F (36.6 C)] 97.7 F (36.5 C) (02/11 0517) Pulse Rate:  [58-119] 62 (02/11 0628) Resp:  [12-27] 18 (02/11 0517) BP: (93-107)/(68-82) 103/70 mmHg (02/11 0517) SpO2:  [92 %-98 %] 92 % (02/11 0517)  Intake/Output from previous day: 02/10 0701 - 02/11 0700 In: 150 [I.V.:150] Out: 300 [Urine:300] Intake/Output from this shift:    Physical Exam: Neck: no adenopathy, no carotid bruit, no JVD and supple, symmetrical, trachea midline Lungs: Decreased breath sound at left base with rhonchi noted Heart: irregularly irregular rhythm, S1, S2 normal and Soft systolic murmur noted Abdomen: soft, non-tender; bowel sounds normal; no masses,  no organomegaly Extremities: extremities normal, atraumatic, no cyanosis or edema  Lab Results:  Recent Labs  05/21/14 0517 05/22/14 0557  WBC 9.9 10.2  HGB 14.8 15.0  PLT 141* 140*    Recent Labs  05/20/14 0047 05/20/14 0705  NA 130* 131*  K 3.8 3.7  CL 97 96  CO2 25 20  GLUCOSE 105* 84  BUN 17 17  CREATININE 0.91 0.83    Recent Labs  05/19/14 1940  TROPONINI <0.03   Hepatic Function Panel  Recent Labs  05/20/14 0047  PROT 6.7  ALBUMIN 2.8*  AST 156*  ALT 113*  ALKPHOS 574*  BILITOT 3.1*    Recent Labs  05/20/14 0705  CHOL 168   No results for input(s): PROTIME in the last 72 hours.  Imaging: Imaging results have been reviewed and Mr Jeri Cos Wo Contrast  05/21/2014   CLINICAL DATA:  Suspected lung cancer with metastatic disease. Staging. Shortness of breath but no current neurologic symptoms.  EXAM: MRI HEAD WITHOUT AND WITH CONTRAST  TECHNIQUE: Multiplanar, multiecho pulse sequences of the brain and surrounding structures were obtained without and with  intravenous contrast.  CONTRAST:  40mL MULTIHANCE GADOBENATE DIMEGLUMINE 529 MG/ML IV SOLN  COMPARISON:  None.  FINDINGS: No acute stroke or hemorrhage. No hydrocephalus or extra-axial fluid. Generalized atrophy. T2 and FLAIR hyperintensities throughout the periventricular greater than subcortical white matter, likely chronic microvascular ischemic change. Flow voids are maintained. No foci of chronic hemorrhage. Partial empty sella. No osseous destructive lesions.  Post infusion, there are numerous enhancing lesions throughout the brain consistent with metastatic disease. As seen on postcontrast axial T1 series 12, they include:  - RIGHT cerebellum, image 5, 3 mm.  - RIGHT insula, image 10, 2 mm.  - LEFT occipital periventricular white matter, 2 mm, image 10.  - LEFT occipital cortex, 3 mm, image 10.  - RIGHT lentiform nucleus, 3 mm, image 11.  - LEFT medial occipital cortex, 2 mm, image 11.  - LEFT medial parietal subcortical white matter, 1 mm, image 14.  - more superior LEFT parietal subcortical white matter, 2 mm, image 15.  -RIGHT frontal subcortical white matter, 1 mm, image 16.  - more anterior RIGHT frontal subcortical white matter, 4 mm, image 16. New  - RIGHT posterior frontal subcortical white matter, 1 mm, image 18.  Given the miliary nature of these lesions, and their small size, it is possible that more lesions exists but new lesions listed above can be correlated in at least 1 other plane.  No definite sinus or mastoid disease.  IMPRESSION: At least Eleven suspected metastases, many of which are 3  mm or less. 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  No significant mass effect or cerebral edema associated with these multiple lesions.  Generalized atrophy with mild small vessel disease.  These results will be called to the ordering clinician or representative by the Radiologist Assistant, and communication documented in the PACS or zVision Dashboard.    Electronically Signed   By: Rolla Flatten M.D.   On: 05/21/2014 20:56   Ct Abdomen Pelvis W Contrast  05/21/2014   CLINICAL DATA:  Abnormal CT a chest yesterday. Significant weight loss and metastatic disease.  EXAM: CT ABDOMEN AND PELVIS WITH CONTRAST  TECHNIQUE: Multidetector CT imaging of the abdomen and pelvis was performed using the standard protocol following bolus administration of intravenous contrast.  CONTRAST:  38mL OMNIPAQUE IOHEXOL 300 MG/ML  SOLN  COMPARISON:  CT chest 05/19/2014  FINDINGS: Lung bases demonstrate multiple pulmonary nodules throughout both lungs consistent with metastatic disease. There is consolidation in the left lung base which may represent postobstructive pneumonia from an occult central lesion or it could represent consolidated pneumonia. The esophagus is distended and filled with fluid. Superior air-fluid level. Distal esophageal wall is thickened. Esophageal mass is not excluded. This could also represent reflux disease. Enlarged lymph nodes in the mediastinum.  Multiple heterogeneous hypo enhancing liver lesions likely representing metastases. Mild diffuse upper abdominal ascites extending down along the paracolic gutters into the pelvis. The gallbladder, pancreas, spleen, adrenal glands, kidneys, and inferior vena cava appear normal. Calcification of the abdominal aorta without aneurysm. Scattered retroperitoneal lymph nodes are present measuring up to about 10 mm diameter. These are nonspecific but in the setting of widespread metastasis could represent early metastatic lesions. Stomach is not abnormally distended. Small bowel are decompressed, limiting evaluation of the wall. Contrast material flows through the colon suggesting no evidence of small bowel obstruction. Contrast material and stool demonstrated throughout the colon. No evidence of colonic obstruction. No free air in the abdomen.  Pelvis: Diverticulosis of the sigmoid colon without evidence of diverticulitis.  Free fluid in the pelvis as previously indicated. Prostate gland is not enlarged. Bladder wall is not thickened. Appendix is not identified. Degenerative changes in the spine and hips. There are few day low-attenuation lesions demonstrated in the right hemipelvis which are nonspecific could represent early bone metastases.  IMPRESSION: Diffuse metastatic disease in the visualized lungs and liver. Probable metastatic lymphadenopathy in the mediastinum. Mild enlargement of retroperitoneal lymph nodes possibly representing early metastasis. Diffuse free fluid in the abdomen. Consolidation in the left lower lung may represent pneumonia or postobstructive change. Esophageal dilatation with distal esophageal wall thickening could represent neoplasm or reflux disease.   Electronically Signed   By: Lucienne Capers M.D.   On: 05/21/2014 20:12    Cardiac Studies:  Assessment/Plan:  Status post Atypical chest pain New-onset A. 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

## 2014-05-22 NOTE — Progress Notes (Signed)
Pt having increased amount of bright red blood in sputum. Pt not in any distress at this time. Dr. Alva Garnet paged. No new orders given at this time. Will continue to monitor.

## 2014-05-23 ENCOUNTER — Encounter (HOSPITAL_COMMUNITY): Payer: Self-pay | Admitting: Pulmonary Disease

## 2014-05-23 ENCOUNTER — Ambulatory Visit
Admit: 2014-05-23 | Discharge: 2014-05-23 | Disposition: A | Discharge status: Home / Self Care | Payer: Self-pay | Attending: Radiation Oncology | Admitting: Radiation Oncology

## 2014-05-23 DIAGNOSIS — C799 Secondary malignant neoplasm of unspecified site: Secondary | ICD-10-CM | POA: Insufficient documentation

## 2014-05-23 DIAGNOSIS — Z9889 Other specified postprocedural states: Secondary | ICD-10-CM

## 2014-05-23 DIAGNOSIS — C801 Malignant (primary) neoplasm, unspecified: Secondary | ICD-10-CM

## 2014-05-23 DIAGNOSIS — R970 Elevated carcinoembryonic antigen [CEA]: Secondary | ICD-10-CM

## 2014-05-23 LAB — HEPARIN LEVEL (UNFRACTIONATED)
HEPARIN UNFRACTIONATED: 0.2 [IU]/mL — AB (ref 0.30–0.70)
HEPARIN UNFRACTIONATED: 0.3 [IU]/mL (ref 0.30–0.70)

## 2014-05-23 LAB — CBC
HEMATOCRIT: 43.6 % (ref 39.0–52.0)
Hemoglobin: 14.8 g/dL (ref 13.0–17.0)
MCH: 30.1 pg (ref 26.0–34.0)
MCHC: 33.9 g/dL (ref 30.0–36.0)
MCV: 88.6 fL (ref 78.0–100.0)
Platelets: 135 10*3/uL — ABNORMAL LOW (ref 150–400)
RBC: 4.92 MIL/uL (ref 4.22–5.81)
RDW: 16.9 % — AB (ref 11.5–15.5)
WBC: 10.7 10*3/uL — ABNORMAL HIGH (ref 4.0–10.5)

## 2014-05-23 MED ORDER — HEPARIN (PORCINE) IN NACL 100-0.45 UNIT/ML-% IJ SOLN
1800.0000 [IU]/h | INTRAMUSCULAR | Status: DC
  Administered 2014-05-23: 1800 [IU]/h via INTRAVENOUS
  Filled 2014-05-23: qty 250

## 2014-05-23 MED ORDER — AMIODARONE HCL 200 MG PO TABS
400.0000 mg | ORAL_TABLET | Freq: Two times a day (BID) | ORAL | Status: DC
  Administered 2014-05-23 (×2): 400 mg via ORAL
  Filled 2014-05-23 (×3): qty 2

## 2014-05-23 MED ORDER — DIGOXIN 125 MCG PO TABS
0.1250 mg | ORAL_TABLET | Freq: Every day | ORAL | Status: DC
  Administered 2014-05-23: 0.125 mg via ORAL
  Filled 2014-05-23: qty 1

## 2014-05-23 MED ORDER — APIXABAN 2.5 MG PO TABS
5.0000 mg | ORAL_TABLET | Freq: Two times a day (BID) | ORAL | Status: DC
  Administered 2014-05-23 (×2): 5 mg via ORAL
  Filled 2014-05-23: qty 1
  Filled 2014-05-23: qty 2
  Filled 2014-05-23: qty 1

## 2014-05-23 MED ORDER — PANTOPRAZOLE SODIUM 40 MG PO TBEC
40.0000 mg | DELAYED_RELEASE_TABLET | Freq: Every day | ORAL | Status: DC
  Administered 2014-05-23: 40 mg via ORAL
  Filled 2014-05-23: qty 1

## 2014-05-23 NOTE — Progress Notes (Signed)
UR Completed.  [phone]  

## 2014-05-23 NOTE — Progress Notes (Signed)
Subjective:  Appreciate all consultants help.. Complains of generalized weakness and feeling tired.  Objective:  Vital Signs in the last 24 hours: Temp:  [97.3 F (36.3 C)-97.5 F (36.4 C)] 97.5 F (36.4 C) (02/12 0519) Pulse Rate:  [54-130] 95 (02/12 0519) Resp:  [10-32] 24 (02/12 0519) BP: (98-140)/(70-105) 102/75 mmHg (02/12 0519) SpO2:  [89 %-100 %] 92 % (02/12 0519)  Intake/Output from previous day: 02/11 0701 - 02/12 0700 In: 0  Out: 750 [Urine:750] Intake/Output from this shift: Total I/O In: 240 [P.O.:240] Out: 250 [Urine:250]  Physical Exam: Neck: no adenopathy, no carotid bruit, no JVD and supple, symmetrical, trachea midline Lungs: Decrease breath sounds at bases air entry has improved Heart: irregularly irregular rhythm, S1, S2 normal and Soft systolic murmur noted Abdomen: soft, non-tender; bowel sounds normal; no masses,  no organomegaly Extremities: extremities normal, atraumatic, no cyanosis or edema  Lab Results:  Recent Labs  05/22/14 0557 05/23/14 0339  WBC 10.2 10.7*  HGB 15.0 14.8  PLT 140* 135*   No results for input(s): NA, K, CL, CO2, GLUCOSE, BUN, CREATININE in the last 72 hours. No results for input(s): TROPONINI in the last 72 hours.  Invalid input(s): CK, MB Hepatic Function Panel No results for input(s): PROT, ALBUMIN, AST, ALT, ALKPHOS, BILITOT, BILIDIR, IBILI in the last 72 hours. No results for input(s): CHOL in the last 72 hours. No results for input(s): PROTIME in the last 72 hours.  Imaging: Imaging results have been reviewed and Mr Jeri Cos Wo Contrast  05/21/2014   CLINICAL DATA:  Suspected lung cancer with metastatic disease. Staging. Shortness of breath but no current neurologic symptoms.  EXAM: MRI HEAD WITHOUT AND WITH CONTRAST  TECHNIQUE: Multiplanar, multiecho pulse sequences of the brain and surrounding structures were obtained without and with intravenous contrast.  CONTRAST:  20mL MULTIHANCE GADOBENATE DIMEGLUMINE 529  MG/ML IV SOLN  COMPARISON:  None.  FINDINGS: No acute stroke or hemorrhage. No hydrocephalus or extra-axial fluid. Generalized atrophy. T2 and FLAIR hyperintensities throughout the periventricular greater than subcortical white matter, likely chronic microvascular ischemic change. Flow voids are maintained. No foci of chronic hemorrhage. Partial empty sella. No osseous destructive lesions.  Post infusion, there are numerous enhancing lesions throughout the brain consistent with metastatic disease. As seen on postcontrast axial T1 series 12, they include:  - RIGHT cerebellum, image 5, 3 mm.  - RIGHT insula, image 10, 2 mm.  - LEFT occipital periventricular white matter, 2 mm, image 10.  - LEFT occipital cortex, 3 mm, image 10.  - RIGHT lentiform nucleus, 3 mm, image 11.  - LEFT medial occipital cortex, 2 mm, image 11.  - LEFT medial parietal subcortical white matter, 1 mm, image 14.  - more superior LEFT parietal subcortical white matter, 2 mm, image 15.  -RIGHT frontal subcortical white matter, 1 mm, image 16.  - more anterior RIGHT frontal subcortical white matter, 4 mm, image 16. New  - RIGHT posterior frontal subcortical white matter, 1 mm, image 18.  Given the miliary nature of these lesions, and their small size, it is possible that more lesions exists but new lesions listed above can be correlated in at least 1 other plane.  No definite sinus or mastoid disease.  IMPRESSION: At least Eleven suspected metastases, many of which are 3 mm or less. 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  No significant mass effect or cerebral edema associated with these multiple lesions.  Generalized atrophy with mild small  vessel disease.  These results will be called to the ordering clinician or representative by the Radiologist Assistant, and communication documented in the PACS or zVision Dashboard.   Electronically Signed   By: Rolla Flatten M.D.   On: 05/21/2014 20:56   Ct  Abdomen Pelvis W Contrast  05/21/2014   CLINICAL DATA:  Abnormal CT a chest yesterday. Significant weight loss and metastatic disease.  EXAM: CT ABDOMEN AND PELVIS WITH CONTRAST  TECHNIQUE: Multidetector CT imaging of the abdomen and pelvis was performed using the standard protocol following bolus administration of intravenous contrast.  CONTRAST:  39mL OMNIPAQUE IOHEXOL 300 MG/ML  SOLN  COMPARISON:  CT chest 05/19/2014  FINDINGS: Lung bases demonstrate multiple pulmonary nodules throughout both lungs consistent with metastatic disease. There is consolidation in the left lung base which may represent postobstructive pneumonia from an occult central lesion or it could represent consolidated pneumonia. The esophagus is distended and filled with fluid. Superior air-fluid level. Distal esophageal wall is thickened. Esophageal mass is not excluded. This could also represent reflux disease. Enlarged lymph nodes in the mediastinum.  Multiple heterogeneous hypo enhancing liver lesions likely representing metastases. Mild diffuse upper abdominal ascites extending down along the paracolic gutters into the pelvis. The gallbladder, pancreas, spleen, adrenal glands, kidneys, and inferior vena cava appear normal. Calcification of the abdominal aorta without aneurysm. Scattered retroperitoneal lymph nodes are present measuring up to about 10 mm diameter. These are nonspecific but in the setting of widespread metastasis could represent early metastatic lesions. Stomach is not abnormally distended. Small bowel are decompressed, limiting evaluation of the wall. Contrast material flows through the colon suggesting no evidence of small bowel obstruction. Contrast material and stool demonstrated throughout the colon. No evidence of colonic obstruction. No free air in the abdomen.  Pelvis: Diverticulosis of the sigmoid colon without evidence of diverticulitis. Free fluid in the pelvis as previously indicated. Prostate gland is not  enlarged. Bladder wall is not thickened. Appendix is not identified. Degenerative changes in the spine and hips. There are few day low-attenuation lesions demonstrated in the right hemipelvis which are nonspecific could represent early bone metastases.  IMPRESSION: Diffuse metastatic disease in the visualized lungs and liver. Probable metastatic lymphadenopathy in the mediastinum. Mild enlargement of retroperitoneal lymph nodes possibly representing early metastasis. Diffuse free fluid in the abdomen. Consolidation in the left lower lung may represent pneumonia or postobstructive change. Esophageal dilatation with distal esophageal wall thickening could represent neoplasm or reflux disease.   Electronically Signed   By: Lucienne Capers M.D.   On: 05/21/2014 20:12   Dg C-arm Bronchoscopy  05/22/2014   CLINICAL DATA:    C-ARM BRONCHOSCOPY  Fluoroscopy was utilized by the requesting physician.  No radiographic  interpretation.     Cardiac Studies:  Assessment/Plan:  Status post Atypical chest pain New-onset A. 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 Radiation oncology consultation is called PT consult Will switch IV heparin to Eliquis and change IV amiodarone to by  mouth. Dr. Doylene Canard on call for me for weekend  LOS: 4 days    Lakara Weiland N 05/23/2014, 1:05 PM
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
## 2014-05-23 NOTE — Progress Notes (Signed)
ADDENDUM: Discussed pathology w Dr Donato Heinz. The tumor is clearly NOT small cell carcinoma. He favors adenocarcinoma but multiple stains are pending.  Noted that Dr Estell Harpin note (which I had not been able to locate earlier) describes no mass at the GEJ --no biopsies were sent, accordingly  I have alerted Radiation Oncology to consult on Erik Blake  Will follow with you

## 2014-05-23 NOTE — Progress Notes (Signed)
1739 report called to [place] and spoke with Joellen Jersey, RN

## 2014-05-23 NOTE — Consult Note (Signed)
[place]         [phone] ________________________________  Initial inpatient Consultation  Name: Erik Blake MRN: [id number]  Date: 05/19/2014  DOB: 09/04/1950  REFERRING PHYSICIAN: No ref. provider found  DIAGNOSIS: Diagnoses of SOB (shortness of breath), Metastatic cancer, and S/P bronchoscopy with biopsy were pertinent to this visit.  HISTORY OF PRESENT ILLNESS::Erik Blake is a 64 y.o. male who was admitted with increasing shortness of breath and chest pain. He also described a 50 pound weight loss over 3 months. Chest CT on 05/19/2014 demonstrated innumerable pulmonary nodules throughout both lungs obstruction in the left lower lung. Abdominal and pelvic CT also revealed what appeared to be liver lesions and possible skeletal lesions.   The patient had upper endoscopy on 05/20/2014 and bronchoscopy on 05/20/2014.  On endoscopy, the patient had some esophageal achalasia. On bronchoscopy, he had obstruction of the left lower lobe bronchus by an extrinsic process with extensive mucosal infiltration. Biopsy was obtained and remains pending at this time. Brain MRI was performed revealing numerous brain metastases.  PREVIOUS RADIATION THERAPY: No  Past Medical History  Diagnosis Date  . Hypertension 2012  . Myocardial infarction 2003  . Hyperlipidemia 2012  . Achalasia of esophagus   . Tobacco abuse   . CAD (coronary artery disease)   . Weight loss 05/2014    Approx 50 lbs  . GERD (gastroesophageal reflux disease)   . Metastatic cancer   :  Past Surgical History  Procedure Laterality Date  . Coronary artery bypass graft  2003, 2015   . Left heart catheterization with coronary angiogram N/A 02/28/2012    Procedure: LEFT HEART CATHETERIZATION WITH CORONARY ANGIOGRAM;  Surgeon: Clent Demark, MD;  Location: [facility] CATH LAB;  Service: Cardiovascular;  Laterality: N/A;  . Percutaneous coronary stent intervention (pci-s) N/A 06/05/2012    Procedure:  PERCUTANEOUS CORONARY STENT INTERVENTION (PCI-S);  Surgeon: Clent Demark, MD;  Location: [facility] CATH LAB;  Service: Cardiovascular;  Laterality: N/A;  . Esophagogastroduodenoscopy N/A 05/21/2014    Procedure: ESOPHAGOGASTRODUODENOSCOPY (EGD);  Surgeon: Wonda Horner, MD;  Location: [facility] ENDOSCOPY;  Service: Endoscopy;  Laterality: N/A;  . Video bronchoscopy N/A 05/22/2014    Procedure: VIDEO BRONCHOSCOPY WITH FLUORO;  Surgeon: Wilhelmina Mcardle, MD;  Location: [facility] ENDOSCOPY;  Service: Cardiopulmonary;  Laterality: N/A;  :   Current facility-administered medications:  .  acetaminophen (TYLENOL) tablet 650 mg, 650 mg, Oral, Q4H PRN, Clent Demark, MD .  albuterol (PROVENTIL) (2.5 MG/3ML) 0.083% nebulizer solution 2.5 mg, 2.5 mg, Nebulization, Q3H PRN, Donita Brooks, NP .  ALPRAZolam Duanne Moron) tablet 0.5 mg, 0.5 mg, Oral, BID, Clent Demark, MD, 0.5 mg at 05/23/14 1112 .  amiodarone (PACERONE) tablet 400 mg, 400 mg, Oral, BID, Clent Demark, MD, 400 mg at 05/23/14 1535 .  apixaban (ELIQUIS) tablet 5 mg, 5 mg, Oral, BID, Clent Demark, MD, 5 mg at 05/23/14 1534 .  aspirin EC tablet 81 mg, 81 mg, Oral, Daily, Clent Demark, MD, 81 mg at 05/23/14 1112 .  butamben-tetracaine-benzocaine (CETACAINE) spray 1 spray, 1 spray, Topical, Once, Wilhelmina Mcardle, MD .  digoxin (LANOXIN) tablet 0.125 mg, 0.125 mg, Oral, Daily, Clent Demark, MD, 0.125 mg at 05/23/14 1535 .  feeding supplement (ENSURE COMPLETE) (ENSURE COMPLETE) liquid 237 mL, 237 mL, Oral, BID BM, Clent Demark, MD, 237 mL at 05/20/14 1400 .  levofloxacin (LEVAQUIN) tablet 500 mg, 500 mg, Oral, Daily, Wilhelmina Mcardle, MD, 500  mg at 05/23/14 1111 .  lidocaine (XYLOCAINE) 2 % jelly 1 application, 1 application, Topical, Once, Wilhelmina Mcardle, MD .  metoprolol tartrate (LOPRESSOR) tablet 25 mg, 25 mg, Oral, BID, Clent Demark, MD, 25 mg at 05/23/14 1111 .  nitroGLYCERIN (NITROSTAT) SL tablet 0.4 mg, 0.4 mg, Sublingual, Q5 min PRN, Clent Demark, MD .  ondansetron (ZOFRAN) injection 4 mg, 4 mg, Intravenous, Q6H PRN, Clent Demark, MD .  pantoprazole (PROTONIX) EC tablet 40 mg, 40 mg, Oral, QHS, Clent Demark, MD .  PARoxetine (PAXIL) tablet 20 mg, 20 mg, Oral, Daily, Clent Demark, MD, 20 mg at 05/23/14 1111 .  phenylephrine (NEO-SYNEPHRINE) 0.25 % nasal spray 1 spray, 1 spray, Each Nare, Q6H PRN, Wilhelmina Mcardle, MD:  Allergies  Allergen Reactions  . Isosorbide Swelling  :  Family History  Problem Relation Age of Onset  . Diabetes Mother   . Heart disease Mother   . Heart disease Father   . Diabetes Father   . Diabetes Sister     4 sisters   . Diabetes Brother   . Cancer Neg Hx   :  History   Social History  . Marital Status: Married    Spouse Name: N/A  . Number of Children: 2   . Years of Education: 9   Occupational History  . Disability     Social History Main Topics  . Smoking status: Current Every Day Smoker  . Smokeless tobacco: Never Used  . Alcohol Use: No  . Drug Use: No  . Sexual Activity: No   Other Topics Concern  . Not on file   Social History Narrative   Married.    No sex since 2013 due to wife's concern about heart attack   2 daughters one in [place], one in [place]  :  REVIEW OF SYSTEMS:  A 15 point review of systems is documented in the electronic medical record. This was obtained by the nursing staff. However, I reviewed this with the patient to discuss relevant findings and make appropriate changes.  Pertinent items are noted in HPI.   PHYSICAL EXAM:  Blood pressure 103/75, pulse 92, temperature 98 F (36.7 C), temperature source Oral, resp. rate 21, height 5\' 8"  (1.727 m), weight 170 lb 6.7 oz (77.3 kg), SpO2 91 %. Per critical care: General: Thin adult male in NAD3   Neuro: AAOx4, speech clear, MAE   HEENT: Mm pink/moist, no JVD   Cardiovascular: s1s2 irr irr, no m/r/g   Lungs: resp's even/non-labored, lungs bilaterally clear, diminished  LL Abdomen: NTND, bsx4 active    Ext: no edema. Advanced digital clubbing    Skin: Warm/dry, no edema  KPS = 30  100 - Normal; no complaints; no evidence of disease. 90   - Able to carry on normal activity; minor signs or symptoms of disease. 80   - Normal activity with effort; some signs or symptoms of disease. 81   - Cares for self; unable to carry on normal activity or to do active work. 60   - Requires occasional assistance, but is able to care for most of his personal needs. 50   - Requires considerable assistance and frequent medical care. 37   - Disabled; requires special care and assistance. 73   - Severely disabled; hospital admission is indicated although death not imminent. 34   - Very sick; hospital admission necessary; active supportive treatment necessary. 10   - Moribund; fatal processes progressing  rapidly. 0     - Dead  Karnofsky DA, Abelmann [place], Craver LS and Burchenal JH [phone]) The use of the nitrogen mustards in the palliative treatment of carcinoma: with particular reference to bronchogenic carcinoma Cancer 1 634-56  LABORATORY DATA:  Lab Results  Component Value Date   WBC 10.7* 05/23/2014   HGB 14.8 05/23/2014   HCT 43.6 05/23/2014   MCV 88.6 05/23/2014   PLT 135* 05/23/2014   Lab Results  Component Value Date   NA 131* 05/20/2014   K 3.7 05/20/2014   CL 96 05/20/2014   CO2 20 05/20/2014   Lab Results  Component Value Date   ALT 113* 05/20/2014   AST 156* 05/20/2014   ALKPHOS 574* 05/20/2014   BILITOT 3.1* 05/20/2014     RADIOGRAPHY: Ct Angio Chest Pe W/cm &/or Wo Cm  05/19/2014   CLINICAL DATA:  Shortness of Breath  EXAM: CT ANGIOGRAPHY CHEST WITH CONTRAST  TECHNIQUE: Multidetector CT imaging of the chest was performed using the standard protocol during bolus administration of intravenous contrast. Multiplanar CT image reconstructions and MIPs were obtained to evaluate the vascular anatomy.  CONTRAST:  162mL OMNIPAQUE IOHEXOL 350 MG/ML SOLN   COMPARISON:  Chest radiograph May 19, 2014  FINDINGS: There is no demonstrable pulmonary embolus. There is no appreciable thoracic aortic aneurysm.  There are innumerable small pulmonary nodular lesions throughout the lungs consistent with metastatic foci. Nodular lesions range in size from as small as 2 mm to as large as 1.8 x 1.7 cm on the right and in the left mid lung measuring 1.6 x 1.3 cm. There is consolidation throughout much of the left lower lobe.  There is an enlarged lymph node in the aortopulmonary window region measuring 2.0 x 1.6 cm. There is adenopathy to the left of the trachea measuring 2.8 by 1.5 cm. There is adenopathy anterior to the carina measuring 1.9 x 1.7 cm. There is sub- carinal adenopathy measuring 3.4 x 1.8 cm. There is probable adenopathy in the left hilum which is indistinguishable from the adjacent airspace consolidation. There may well be an endobronchial mass in the left lower lobe as well.  There is fluid in wall thickening throughout the esophagus from the level of the aortic arch to the gastroesophageal junction. Proximal to this area, there is distention esophagus with air.  Pericardium is not thickened. Patient is status post coronary artery bypass grafting.  In the visualized upper abdomen, mild ascites is appreciable. There is a nodular lesion in the posterior segment of the right lobe of the liver measuring 1.7 x 1.5 cm.  There are no blastic or lytic bone lesions. There is slight anterior wedging of 2 mid thoracic vertebral bodies. Thyroid appears unremarkable.  Review of the MIP images confirms the above findings.  IMPRESSION: No demonstrable pulmonary embolus.  Innumerable pulmonary nodular lesions throughout the lungs consistent with widespread metastatic disease. There also foci of adenopathy. There is left lower lobe consolidation. There may be an endobronchial lesion obstructing the left lower lobe bronchus.  Generalized esophageal dilatation with fluid extending  from the level of the aortic arch to the gastroesophageal junction. This finding could indicate a distal esophageal mass. Direct visualization may be advisable given this appearance.  Small nodular lesion in the right lobe of liver, concerning for metastatic focus given other findings.   Electronically Signed   By: Lowella Grip M.D.   On: 05/19/2014 21:42   Mr Jeri Cos WI Contrast  05/21/2014   CLINICAL DATA:  Suspected lung cancer with metastatic disease. Staging. Shortness of breath but no current neurologic symptoms.  EXAM: MRI HEAD WITHOUT AND WITH CONTRAST  TECHNIQUE: Multiplanar, multiecho pulse sequences of the brain and surrounding structures were obtained without and with intravenous contrast.  CONTRAST:  19mL MULTIHANCE GADOBENATE DIMEGLUMINE 529 MG/ML IV SOLN  COMPARISON:  None.  FINDINGS: No acute stroke or hemorrhage. No hydrocephalus or extra-axial fluid. Generalized atrophy. T2 and FLAIR hyperintensities throughout the periventricular greater than subcortical white matter, likely chronic microvascular ischemic change. Flow voids are maintained. No foci of chronic hemorrhage. Partial empty sella. No osseous destructive lesions.  Post infusion, there are numerous enhancing lesions throughout the brain consistent with metastatic disease. As seen on postcontrast axial T1 series 12, they include:  - RIGHT cerebellum, image 5, 3 mm.  - RIGHT insula, image 10, 2 mm.  - LEFT occipital periventricular white matter, 2 mm, image 10.  - LEFT occipital cortex, 3 mm, image 10.  - RIGHT lentiform nucleus, 3 mm, image 11.  - LEFT medial occipital cortex, 2 mm, image 11.  - LEFT medial parietal subcortical white matter, 1 mm, image 14.  - more superior LEFT parietal subcortical white matter, 2 mm, image 15.  -RIGHT frontal subcortical white matter, 1 mm, image 16.  - more anterior RIGHT frontal subcortical white matter, 4 mm, image 16. New  - RIGHT posterior frontal subcortical white matter, 1 mm, image 18.   Given the miliary nature of these lesions, and their small size, it is possible that more lesions exists but new lesions listed above can be correlated in at least 1 other plane.  No definite sinus or mastoid disease.  IMPRESSION: At least Eleven suspected metastases, many of which are 3 mm or less. 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  No significant mass effect or cerebral edema associated with these multiple lesions.  Generalized atrophy with mild small vessel disease.  These results will be called to the ordering clinician or representative by the Radiologist Assistant, and communication documented in the PACS or zVision Dashboard.   Electronically Signed   By: Rolla Flatten M.D.   On: 05/21/2014 20:56   Ct Abdomen Pelvis W Contrast  05/21/2014   CLINICAL DATA:  Abnormal CT a chest yesterday. Significant weight loss and metastatic disease.  EXAM: CT ABDOMEN AND PELVIS WITH CONTRAST  TECHNIQUE: Multidetector CT imaging of the abdomen and pelvis was performed using the standard protocol following bolus administration of intravenous contrast.  CONTRAST:  30mL OMNIPAQUE IOHEXOL 300 MG/ML  SOLN  COMPARISON:  CT chest 05/19/2014  FINDINGS: Lung bases demonstrate multiple pulmonary nodules throughout both lungs consistent with metastatic disease. There is consolidation in the left lung base which may represent postobstructive pneumonia from an occult central lesion or it could represent consolidated pneumonia. The esophagus is distended and filled with fluid. Superior air-fluid level. Distal esophageal wall is thickened. Esophageal mass is not excluded. This could also represent reflux disease. Enlarged lymph nodes in the mediastinum.  Multiple heterogeneous hypo enhancing liver lesions likely representing metastases. Mild diffuse upper abdominal ascites extending down along the paracolic gutters into the pelvis. The gallbladder, pancreas, spleen, adrenal glands,  kidneys, and inferior vena cava appear normal. Calcification of the abdominal aorta without aneurysm. Scattered retroperitoneal lymph nodes are present measuring up to about 10 mm diameter. These are nonspecific but in the setting of widespread metastasis could represent early metastatic lesions. Stomach is not abnormally distended. Small bowel are  decompressed, limiting evaluation of the wall. Contrast material flows through the colon suggesting no evidence of small bowel obstruction. Contrast material and stool demonstrated throughout the colon. No evidence of colonic obstruction. No free air in the abdomen.  Pelvis: Diverticulosis of the sigmoid colon without evidence of diverticulitis. Free fluid in the pelvis as previously indicated. Prostate gland is not enlarged. Bladder wall is not thickened. Appendix is not identified. Degenerative changes in the spine and hips. There are few day low-attenuation lesions demonstrated in the right hemipelvis which are nonspecific could represent early bone metastases.  IMPRESSION: Diffuse metastatic disease in the visualized lungs and liver. Probable metastatic lymphadenopathy in the mediastinum. Mild enlargement of retroperitoneal lymph nodes possibly representing early metastasis. Diffuse free fluid in the abdomen. Consolidation in the left lower lung may represent pneumonia or postobstructive change. Esophageal dilatation with distal esophageal wall thickening could represent neoplasm or reflux disease.   Electronically Signed   By: Lucienne Capers M.D.   On: 05/21/2014 20:12   Dg Chest Portable 1 View  05/19/2014   CLINICAL DATA:  Cough and shortness of breath for 3 weeks  EXAM: PORTABLE CHEST - 1 VIEW  COMPARISON:  None.  FINDINGS: There is underlying emphysema. There is airspace consolidation in the left lower lobe.  There is a 1.8 x 1.5 cm nodular lesion in the right mid lung.  Heart size is normal. Pulmonary vascularity reflects underlying emphysema. Patient is  status post coronary artery bypass grafting. No adenopathy.  IMPRESSION: 1.8 x 1.5 cm nodular lesion right mid lung. Advise noncontrast enhanced chest CT to further evaluate.  Left lower lobe consolidation.  Underlying emphysema.   Electronically Signed   By: Lowella Grip M.D.   On: 05/19/2014 20:14   Dg C-arm Bronchoscopy  05/22/2014   CLINICAL DATA:    C-ARM BRONCHOSCOPY  Fluoroscopy was utilized by the requesting physician.  No radiographic  interpretation.       IMPRESSION: This gentleman is a 64 year old man with innumerable rein lesions consistent with metastatic disease.  He also has innumerable pulmonary lesions consistent with metastatic disease. He underwent bronchoscopy, and biopsy remains pending. The clinical appearance is consistent with widespread stage IV malignancy although other etiologies have not been definitively rule out.  PLAN:Today, I talked to the patient and family about the findings and work-up thus far.  We discussed the natural history of brain metastases and general treatment, highlighting the role of radiotherapy in the management.  We discussed the available radiation techniques, and focused on the details of logistics and delivery.  We reviewed the anticipated acute and late sequelae associated with radiation in this setting.  The patient was encouraged to ask questions that I answered to the best of my ability.    At this point, we will await the results of his bronchoscopic biopsy. I have suggested the patient be potentially transferred to [facility] long hospital to facilitate the initiation of treatment once the tissue diagnosis is confirmed.  I spent 60 minutes minutes face to face with the patient and more than 50% of that time was spent in counseling and/or coordination of care.     ------------------------------------------------  Sheral Apley. Tammi Klippel, M.D.

## 2014-05-23 NOTE — Progress Notes (Signed)
[place] for heparin--> to Eliquis Indication: atrial fibrillation  Allergies  Allergen Reactions  . Isosorbide Swelling    Patient Measurements: Height: 5\' 8"  (172.7 cm) Weight: 170 lb 6.7 oz (77.3 kg) IBW/kg (Calculated) : 68.4   Vital Signs: Temp: 97.5 F (36.4 C) (02/12 0519) Temp Source: Oral (02/12 0519) BP: 102/75 mmHg (02/12 0519) Pulse Rate: 95 (02/12 0519)  Labs:  Recent Labs  05/21/14 0517  05/22/14 0557 05/22/14 2138 05/23/14 0339 05/23/14 1200  HGB 14.8  --  15.0  --  14.8  --   HCT 42.9  --  44.6  --  43.6  --   PLT 141*  --  140*  --  135*  --   HEPARINUNFRC  --   < > 0.26* 0.21* 0.20* 0.30  < > = values in this interval not displayed.  Estimated Creatinine Clearance: 89.3 mL/min (by C-G formula based on Cr of 0.83).  Assessment: 64 year old male with Afib previously on heparin Now tor transition to Eliquis  Goal of Therapy:   Monitor platelets by anticoagulation protocol: Yes   Plan:  Eliquis 5 mg po BID Pharmacy to follow   Thank you. Anette Guarneri, PharmD [phone]

## 2014-05-23 NOTE — Progress Notes (Signed)
  Radiation Oncology         [phone] ________________________________  Name: Erik Blake MRN: [id number]  Date: 05/19/2014  DOB: 1950-09-16  Chart Note:  Rad-Onc Consult pending, chart and films reviewed.  Await pathology, but, would suggest transfer to [facility] to start whole brain irradiation when pathology confirmed. ________________________________  Sheral Apley. Tammi Klippel, M.D.

## 2014-05-23 NOTE — Progress Notes (Signed)
ANTICOAGULATION CONSULT NOTE - Follow Up Consult  Pharmacy Consult for heparin Indication: atrial fibrillation  Labs:  Recent Labs  05/20/14 0705  05/21/14 0517  05/22/14 0557 05/22/14 2138 05/23/14 0339  HGB 15.0  --  14.8  --  15.0  --  14.8  HCT 44.3  --  42.9  --  44.6  --  43.6  PLT 150  --  141*  --  140*  --  135*  HEPARINUNFRC  --   < >  --   < > 0.26* 0.21* 0.20*  CREATININE 0.83  --   --   --   --   --   --   < > = values in this interval not displayed.   Assessment: 64yo male remains subtherapeutic on heparin with no change in level after rate increase; sputum remains pink-tinged though no worsening.  Goal of Therapy:  Heparin level 0.3-0.7 units/ml   Plan:  Will increase heparin gtt by 2 units/kg/hr to 1800 units/hr and check level in 6hr.  Wynona Neat, PharmD, BCPS  05/23/2014,5:18 AM

## 2014-05-23 NOTE — Progress Notes (Signed)
No post bronch complications. Path results still pending but preliminary demonstrate NSCCa Based on the appearance of the LLL, I think this is probably a lung primary I have encouraged that he needs to get therapy started as soon as possible and I am pleased to see that this is the plan per Dr Jana Hakim. PCCM will be available as needed  Merton Border, MD ; [facility] [phone].  After 5:30 PM or weekends, call [phone]

## 2014-05-24 MED ORDER — AMIODARONE HCL 200 MG PO TABS: 200.0000 mg | ORAL_TABLET | Freq: Two times a day (BID) | ORAL | Status: DC

## 2014-05-24 MED ORDER — NITROGLYCERIN 0.4 MG SL SUBL: 0.4000 mg | SUBLINGUAL_TABLET | SUBLINGUAL | Status: DC | PRN

## 2014-05-24 MED ORDER — LEVOFLOXACIN 500 MG PO TABS: 500.0000 mg | ORAL_TABLET | Freq: Every day | ORAL | Status: DC

## 2014-05-24 MED ORDER — APIXABAN 5 MG PO TABS: 5.0000 mg | ORAL_TABLET | Freq: Two times a day (BID) | ORAL | Status: DC

## 2014-05-24 MED ORDER — PANTOPRAZOLE SODIUM 40 MG PO TBEC: 40.0000 mg | DELAYED_RELEASE_TABLET | Freq: Every day | ORAL | Status: DC

## 2014-05-24 MED ORDER — DIGOXIN 125 MCG PO TABS: 0.0625 mg | ORAL_TABLET | Freq: Every day | ORAL | Status: DC

## 2014-05-24 NOTE — Discharge Summary (Signed)
Physician Discharge Summary  Patient ID: Erik Blake MRN: [id number] DOB/AGE: 1950-08-19 64 y.o.  Admit date: 05/19/2014 Discharge date: 05/24/2014  Admission Diagnoses: Status post Atypical chest pain New-onset A. 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 status post EGD Coronary artery disease history of MI 3 in the past status post CABG status post PCI to protected left main and left circumflex approximately 2 years ago Hypertension Prediabetic History of Tobacco abuse approximately 50 pack years Significant weight loss and cachexia Depression Positive family history of coronary artery disease  Discharge Diagnoses:  Principle Problem: * Atrial fibrillation with RVR *   Protein-calorie malnutrition, severe   Metastatic cancer   S/P bronchoscopy with biopsy Status post Atypical chest pain Left lower lobe pneumonia rule out endobronchial lesion 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 status post fiberoptic bronchoscopy/biopsy 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 He underwent bronchial brush biopsy showing malignant cells and also has brain metastasis. He was waiting for radiation treatment but today decided to leave AMA in spite of  offering IV lorazepam to relax. He will be followed by Oncology/Radiation oncology on out patient basis and wife agreed to take that responsibility.   Consults: cardiology and hematology/oncology  Significant Diagnostic Studies: labs: Near normal CBC with mildly low Platelets count. Elevated CEA and cancer antigen 19-9. Mildly elevated LFT and low albumin of 2.8 g/dL. EKG-atrial fibrillation with controlled ventricular response. CT chest: No demonstrable pulmonary embolus.  Innumerable pulmonary nodular lesions throughout the lungs consistent with widespread metastatic disease. There also foci of adenopathy. There is left lower lobe consolidation. There may be an endobronchial lesion obstructing the left lower lobe bronchus.  Generalized esophageal dilatation with fluid extending from the level of the aortic arch to the gastroesophageal junction. This finding could indicate a distal esophageal mass. Direct visualization may be advisable given this appearance.  Small nodular lesion in the right lobe of liver, concerning for metastatic focus given other findings. At least Eleven suspected metastases, many of which are 3 mm or less. 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  No significant mass effect or cerebral edema associated with these multiple lesions.  Generalized atrophy with mild small vessel disease.  MR brain: At least Eleven suspected metastases, many of which are 3 mm or less. 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  No significant mass effect or cerebral edema associated with these multiple lesions.  Generalized atrophy with mild small vessel disease.  Treatments: cardiac meds: metoprolol and amiodarone. Levaquin for pneumonia. Awaiting radition and chemotherapy by Oncology/Radiation oncology.  Discharge Exam: Blood pressure 100/58, pulse 89, temperature 97.7 F (36.5 C),  temperature source Oral, resp. rate 20, height 5\' 8"  (1.727 m), weight 80.8 kg (178 lb 2.1 oz), SpO2 96 %. HEENT: [place]/AT, Conj-pink, tongue, pink and midline.Sclera-minimal yellow tinge. Neck: no adenopathy, no carotid bruit, no JVD  and supple, symmetrical, trachea midline Lungs: Decreased breath sound at left base with rhonchi noted Heart: irregularly irregular rhythm, S1, S2 normal and Soft systolic murmur noted Abdomen: soft, non-tender; bowel sounds normal; no masses, no organomegaly Extremities: extremities normal, atraumatic, no cyanosis or edema. Positive clubbing CNS: Moves all 4 extremities. Cranial nerves grossly intact. Skin: Dry.  Disposition: 07-Left Against Medical Advice     Medication List    STOP taking these medications        amLODipine 5 MG tablet  Commonly known as:  NORVASC     clopidogrel 75 MG tablet  Commonly known as:  PLAVIX      TAKE these medications        ALPRAZolam 0.5 MG tablet  Commonly known as:  XANAX  Take 0.5 mg by mouth 2 (two) times daily.     amiodarone 200 MG tablet  Commonly known as:  PACERONE  Take 1 tablet (200 mg total) by mouth 2 (two) times daily. Decrease to once daily after 1 week.     apixaban 5 MG Tabs tablet  Commonly known as:  ELIQUIS  Take 1 tablet (5 mg total) by mouth 2 (two) times daily.     aspirin 81 MG tablet  Take 81 mg by mouth daily.     digoxin 0.125 MG tablet  Commonly known as:  LANOXIN  Take 0.5 tablets (0.0625 mg total) by mouth daily.     levofloxacin 500 MG tablet  Commonly known as:  LEVAQUIN  Take 1 tablet (500 mg total) by mouth daily.     metoprolol succinate 50 MG 24 hr tablet  Commonly known as:  TOPROL-XL  Take 50 mg by mouth daily. Take with or immediately following a meal.     nitroGLYCERIN 0.4 MG SL tablet  Commonly known as:  NITROSTAT  Place 1 tablet (0.4 mg total) under the tongue every 5 (five) minutes as needed (for chest pain).     pantoprazole 40 MG tablet  Commonly  known as:  PROTONIX  Take 1 tablet (40 mg total) by mouth at bedtime.     PARoxetine 20 MG tablet  Commonly known as:  PAXIL  Take 20 mg by mouth daily.     simvastatin 40 MG tablet  Commonly known as:  ZOCOR  Take 40 mg by mouth every morning.           Follow-up Information    Follow up with Tyler Pita A, MD. Schedule an appointment as soon as possible for a visit in 2 days.   Specialty:  Radiation Oncology   Contact information:   [address] [place] [address] [phone]       Signed: Birdie Riddle 05/24/2014, 10:46 AM

## 2014-05-26 LAB — CULTURE, BLOOD (ROUTINE X 2)
CULTURE: NO GROWTH
CULTURE: NO GROWTH

## 2014-05-27 ENCOUNTER — Telehealth: Payer: Self-pay | Admitting: *Deleted

## 2014-05-27 ENCOUNTER — Other Ambulatory Visit: Payer: Self-pay | Admitting: *Deleted

## 2014-05-27 ENCOUNTER — Other Ambulatory Visit: Payer: Self-pay | Admitting: Oncology

## 2014-05-27 DIAGNOSIS — C799 Secondary malignant neoplasm of unspecified site: Secondary | ICD-10-CM

## 2014-05-27 NOTE — Telephone Encounter (Signed)
Called to set up with Dr. Julien Nordmann.  Wife stated patient is asleep.  I gave her an appt to see Dr. Julien Nordmann on 06/03/14 arrival at 1:30.  She verbalized understanding of appt time and place.

## 2014-05-29 ENCOUNTER — Telehealth: Payer: Self-pay

## 2014-05-29 ENCOUNTER — Encounter: Payer: Self-pay | Admitting: *Deleted

## 2014-05-29 ENCOUNTER — Telehealth: Payer: Self-pay | Admitting: Radiation Oncology

## 2014-05-29 DIAGNOSIS — C7931 Secondary malignant neoplasm of brain: Secondary | ICD-10-CM | POA: Insufficient documentation

## 2014-05-29 NOTE — Telephone Encounter (Signed)
It looks like he was discharged without his dexamethasone.  That should help with his nausea, given numerous brain metastases.

## 2014-05-29 NOTE — [facility] Oncology Navigator Note (Unsigned)
I notified [facility] dept, Tammy, to send tissue to [facility]

## 2014-05-29 NOTE — Telephone Encounter (Signed)
Received call from patient's wife, Cecille Rubin, who is concerned about his well being. She reports he "sleeps a lot," has pain in his legs and generalized weakness. She goes on to say "he stood up the other night and was so weak he slide down the chair to the floor." She states, "everything he eats comes back up." She confirms the only thing he has had to eat in the last 24 hours is cereal which he vomited back up. She reports his gastroenterologist is aware of these issue and has changed his medication but, the "new medication isn't working either." Patient has a SIM appointment for whole brain with Dr. Tammi Klippel tomorrow. Encouraged wife to bring patient to [facility] emergency room for evaluation. Explained if her husband was admitted the simulation could be done as an inpatient. She verbalized understanding.

## 2014-05-29 NOTE — Progress Notes (Signed)
  Radiation Oncology         [phone] ________________________________  Name: Erik Blake MRN: [id number]  Date: 05/30/2014  DOB: [birth year]  SIMULATION AND TREATMENT PLANNING NOTE    ICD-9-CM ICD-10-CM   1. Brain metastases 198.3 C79.31     DIAGNOSIS:  64 year old man with innumerable brain metastases from adenocarcinoma of the lung  NARRATIVE:  The patient was brought to the [facility].  Identity was confirmed.  All relevant records and images related to the planned course of therapy were reviewed.  The patient freely provided informed written consent to proceed with treatment after reviewing the details related to the planned course of therapy. The consent form was witnessed and verified by the simulation staff.  Then, the patient was set-up in a stable reproducible  supine position for radiation therapy.  CT images were obtained.  Surface markings were placed.  The CT images were loaded into the planning software.  Then the target and avoidance structures were contoured.  Treatment planning then occurred.  The radiation prescription was entered and confirmed.  Then, I designed and supervised the construction of a total of 3 medically necessary complex treatment devices including a thermoplastic immobilization mask, and 2 multi-leaf collimators to shape radiation around the brain shielding the critical eyes and face.  I have requested : Isodose Plan.    PLAN:  The patient will receive 35 Gy in 14 fractions.  ________________________________  Sheral Apley Tammi Klippel, M.D.

## 2014-05-29 NOTE — Telephone Encounter (Signed)
Wife left message for samantha to call her home [phone], cell [phone]. Transferred message to [facility]

## 2014-05-30 ENCOUNTER — Inpatient Hospital Stay (HOSPITAL_COMMUNITY)
Admission: EM | Admit: 2014-05-30 | Discharge: 2014-06-04 | DRG: 180 | Disposition: A | Discharge status: Hospice | Payer: Medicaid Other | Attending: Cardiology | Admitting: Cardiology

## 2014-05-30 ENCOUNTER — Encounter (HOSPITAL_COMMUNITY): Payer: Self-pay | Admitting: Emergency Medicine

## 2014-05-30 ENCOUNTER — Ambulatory Visit
Admission: RE | Admit: 2014-05-30 | Discharge: 2014-05-30 | Disposition: A | Discharge status: Home / Self Care | Payer: Medicaid Other | Source: Ambulatory Visit | Attending: Radiation Oncology | Admitting: Radiation Oncology

## 2014-05-30 DIAGNOSIS — Z7982 Long term (current) use of aspirin: Secondary | ICD-10-CM | POA: Diagnosis not present

## 2014-05-30 DIAGNOSIS — R059 Cough, unspecified: Secondary | ICD-10-CM

## 2014-05-30 DIAGNOSIS — R7309 Other abnormal glucose: Secondary | ICD-10-CM | POA: Diagnosis present

## 2014-05-30 DIAGNOSIS — Z79899 Other long term (current) drug therapy: Secondary | ICD-10-CM

## 2014-05-30 DIAGNOSIS — C3490 Malignant neoplasm of unspecified part of unspecified bronchus or lung: Secondary | ICD-10-CM | POA: Diagnosis not present

## 2014-05-30 DIAGNOSIS — R627 Adult failure to thrive: Secondary | ICD-10-CM | POA: Diagnosis present

## 2014-05-30 DIAGNOSIS — R531 Weakness: Secondary | ICD-10-CM | POA: Diagnosis not present

## 2014-05-30 DIAGNOSIS — E785 Hyperlipidemia, unspecified: Secondary | ICD-10-CM | POA: Diagnosis present

## 2014-05-30 DIAGNOSIS — D6959 Other secondary thrombocytopenia: Secondary | ICD-10-CM | POA: Diagnosis present

## 2014-05-30 DIAGNOSIS — Z833 Family history of diabetes mellitus: Secondary | ICD-10-CM | POA: Diagnosis not present

## 2014-05-30 DIAGNOSIS — I2510 Atherosclerotic heart disease of native coronary artery without angina pectoris: Secondary | ICD-10-CM | POA: Diagnosis present

## 2014-05-30 DIAGNOSIS — J690 Pneumonitis due to inhalation of food and vomit: Secondary | ICD-10-CM | POA: Diagnosis not present

## 2014-05-30 DIAGNOSIS — Z515 Encounter for palliative care: Secondary | ICD-10-CM

## 2014-05-30 DIAGNOSIS — C7931 Secondary malignant neoplasm of brain: Secondary | ICD-10-CM | POA: Diagnosis present

## 2014-05-30 DIAGNOSIS — Z955 Presence of coronary angioplasty implant and graft: Secondary | ICD-10-CM | POA: Diagnosis not present

## 2014-05-30 DIAGNOSIS — C787 Secondary malignant neoplasm of liver and intrahepatic bile duct: Secondary | ICD-10-CM | POA: Diagnosis present

## 2014-05-30 DIAGNOSIS — I252 Old myocardial infarction: Secondary | ICD-10-CM | POA: Diagnosis not present

## 2014-05-30 DIAGNOSIS — R17 Unspecified jaundice: Secondary | ICD-10-CM | POA: Diagnosis present

## 2014-05-30 DIAGNOSIS — I482 Chronic atrial fibrillation: Secondary | ICD-10-CM | POA: Diagnosis present

## 2014-05-30 DIAGNOSIS — R1310 Dysphagia, unspecified: Secondary | ICD-10-CM | POA: Diagnosis present

## 2014-05-30 DIAGNOSIS — Z8249 Family history of ischemic heart disease and other diseases of the circulatory system: Secondary | ICD-10-CM | POA: Diagnosis not present

## 2014-05-30 DIAGNOSIS — G936 Cerebral edema: Secondary | ICD-10-CM | POA: Diagnosis present

## 2014-05-30 DIAGNOSIS — C771 Secondary and unspecified malignant neoplasm of intrathoracic lymph nodes: Secondary | ICD-10-CM | POA: Diagnosis present

## 2014-05-30 DIAGNOSIS — C7800 Secondary malignant neoplasm of unspecified lung: Secondary | ICD-10-CM | POA: Diagnosis present

## 2014-05-30 DIAGNOSIS — E43 Unspecified severe protein-calorie malnutrition: Secondary | ICD-10-CM | POA: Diagnosis present

## 2014-05-30 DIAGNOSIS — R188 Other ascites: Secondary | ICD-10-CM | POA: Diagnosis present

## 2014-05-30 DIAGNOSIS — I10 Essential (primary) hypertension: Secondary | ICD-10-CM | POA: Diagnosis present

## 2014-05-30 DIAGNOSIS — Z6826 Body mass index (BMI) 26.0-26.9, adult: Secondary | ICD-10-CM | POA: Diagnosis not present

## 2014-05-30 DIAGNOSIS — G893 Neoplasm related pain (acute) (chronic): Secondary | ICD-10-CM

## 2014-05-30 DIAGNOSIS — F17210 Nicotine dependence, cigarettes, uncomplicated: Secondary | ICD-10-CM | POA: Diagnosis present

## 2014-05-30 DIAGNOSIS — Z951 Presence of aortocoronary bypass graft: Secondary | ICD-10-CM

## 2014-05-30 DIAGNOSIS — K220 Achalasia of cardia: Secondary | ICD-10-CM | POA: Diagnosis present

## 2014-05-30 DIAGNOSIS — T380X5A Adverse effect of glucocorticoids and synthetic analogues, initial encounter: Secondary | ICD-10-CM | POA: Diagnosis present

## 2014-05-30 DIAGNOSIS — K219 Gastro-esophageal reflux disease without esophagitis: Secondary | ICD-10-CM | POA: Diagnosis present

## 2014-05-30 DIAGNOSIS — F329 Major depressive disorder, single episode, unspecified: Secondary | ICD-10-CM | POA: Diagnosis present

## 2014-05-30 DIAGNOSIS — E860 Dehydration: Secondary | ICD-10-CM | POA: Diagnosis present

## 2014-05-30 DIAGNOSIS — R05 Cough: Secondary | ICD-10-CM

## 2014-05-30 DIAGNOSIS — R1110 Vomiting, unspecified: Secondary | ICD-10-CM | POA: Diagnosis present

## 2014-05-30 DIAGNOSIS — R112 Nausea with vomiting, unspecified: Secondary | ICD-10-CM

## 2014-05-30 DIAGNOSIS — I444 Left anterior fascicular block: Secondary | ICD-10-CM | POA: Diagnosis present

## 2014-05-30 DIAGNOSIS — R1314 Dysphagia, pharyngoesophageal phase: Secondary | ICD-10-CM

## 2014-05-30 DIAGNOSIS — Z66 Do not resuscitate: Secondary | ICD-10-CM | POA: Diagnosis present

## 2014-05-30 LAB — CBC WITH DIFFERENTIAL/PLATELET
Basophils Absolute: 0 10*3/uL (ref 0.0–0.1)
Basophils Relative: 0 % (ref 0–1)
Eosinophils Absolute: 0.4 10*3/uL (ref 0.0–0.7)
Eosinophils Relative: 2 % (ref 0–5)
HEMATOCRIT: 46.9 % (ref 39.0–52.0)
Hemoglobin: 16.3 g/dL (ref 13.0–17.0)
LYMPHS ABS: 0.9 10*3/uL (ref 0.7–4.0)
LYMPHS PCT: 5 % — AB (ref 12–46)
MCH: 31 pg (ref 26.0–34.0)
MCHC: 34.8 g/dL (ref 30.0–36.0)
MCV: 89.2 fL (ref 78.0–100.0)
MONO ABS: 1.6 10*3/uL — AB (ref 0.1–1.0)
Monocytes Relative: 9 % (ref 3–12)
NEUTROS ABS: 15.4 10*3/uL — AB (ref 1.7–7.7)
Neutrophils Relative %: 84 % — ABNORMAL HIGH (ref 43–77)
Platelets: 103 10*3/uL — ABNORMAL LOW (ref 150–400)
RBC: 5.26 MIL/uL (ref 4.22–5.81)
RDW: 19.3 % — ABNORMAL HIGH (ref 11.5–15.5)
WBC: 18.3 10*3/uL — AB (ref 4.0–10.5)

## 2014-05-30 LAB — DIGOXIN LEVEL: Digoxin Level: 0.3 ng/mL — ABNORMAL LOW (ref 0.8–2.0)

## 2014-05-30 LAB — COMPREHENSIVE METABOLIC PANEL
ALT: 105 U/L — AB (ref 0–53)
ANION GAP: 9 (ref 5–15)
AST: 278 U/L — ABNORMAL HIGH (ref 0–37)
Albumin: 2.4 g/dL — ABNORMAL LOW (ref 3.5–5.2)
Alkaline Phosphatase: 543 U/L — ABNORMAL HIGH (ref 39–117)
BUN: 47 mg/dL — ABNORMAL HIGH (ref 6–23)
CALCIUM: 9.2 mg/dL (ref 8.4–10.5)
CO2: 24 mmol/L (ref 19–32)
CREATININE: 1.11 mg/dL (ref 0.50–1.35)
Chloride: 99 mmol/L (ref 96–112)
GFR, EST AFRICAN AMERICAN: 80 mL/min — AB (ref >90)
GFR, EST NON AFRICAN AMERICAN: 69 mL/min — AB (ref >90)
GLUCOSE: 91 mg/dL (ref 70–99)
Potassium: 4.3 mmol/L (ref 3.5–5.1)
Sodium: 132 mmol/L — ABNORMAL LOW (ref 135–145)
Total Bilirubin: 9.5 mg/dL — ABNORMAL HIGH (ref 0.3–1.2)
Total Protein: 6.2 g/dL (ref 6.0–8.3)

## 2014-05-30 LAB — TROPONIN I: TROPONIN I: 0.03 ng/mL (ref <0.031)

## 2014-05-30 LAB — AMMONIA: AMMONIA: 51 umol/L — AB (ref 11–32)

## 2014-05-30 LAB — URINE MICROSCOPIC-ADD ON

## 2014-05-30 LAB — URINALYSIS, ROUTINE W REFLEX MICROSCOPIC
Glucose, UA: NEGATIVE mg/dL
HGB URINE DIPSTICK: NEGATIVE
Ketones, ur: NEGATIVE mg/dL
Nitrite: POSITIVE — AB
Protein, ur: NEGATIVE mg/dL
SPECIFIC GRAVITY, URINE: 1.024 (ref 1.005–1.030)
UROBILINOGEN UA: 4 mg/dL — AB (ref 0.0–1.0)
pH: 5.5 (ref 5.0–8.0)

## 2014-05-30 LAB — LIPASE, BLOOD: LIPASE: 31 U/L (ref 11–59)

## 2014-05-30 MED ORDER — DEXAMETHASONE SODIUM PHOSPHATE 10 MG/ML IJ SOLN
10.0000 mg | Freq: Once | INTRAMUSCULAR | Status: AC
  Administered 2014-05-30: 10 mg via INTRAVENOUS
  Filled 2014-05-30 (×2): qty 1

## 2014-05-30 MED ORDER — METOPROLOL TARTRATE 1 MG/ML IV SOLN
2.5000 mg | INTRAVENOUS | Status: DC | PRN
  Administered 2014-05-31 – 2014-06-03 (×4): 2.5 mg via INTRAVENOUS
  Filled 2014-05-30 (×4): qty 5

## 2014-05-30 MED ORDER — SODIUM CHLORIDE 0.9 % IV BOLUS (SEPSIS)
500.0000 mL | Freq: Once | INTRAVENOUS | Status: AC
  Administered 2014-05-30: 500 mL via INTRAVENOUS

## 2014-05-30 MED ORDER — APIXABAN 5 MG PO TABS
5.0000 mg | ORAL_TABLET | Freq: Two times a day (BID) | ORAL | Status: DC
  Administered 2014-05-30 – 2014-06-02 (×6): 5 mg via ORAL
  Filled 2014-05-30 (×8): qty 1

## 2014-05-30 MED ORDER — AMIODARONE HCL 200 MG PO TABS
200.0000 mg | ORAL_TABLET | Freq: Once | ORAL | Status: AC
  Administered 2014-05-30: 200 mg via ORAL
  Filled 2014-05-30: qty 1

## 2014-05-30 MED ORDER — ONDANSETRON HCL 4 MG/2ML IJ SOLN
4.0000 mg | Freq: Once | INTRAMUSCULAR | Status: AC
  Administered 2014-05-30: 4 mg via INTRAVENOUS
  Filled 2014-05-30: qty 2

## 2014-05-30 MED ORDER — DEXAMETHASONE SODIUM PHOSPHATE 4 MG/ML IJ SOLN
4.0000 mg | Freq: Four times a day (QID) | INTRAMUSCULAR | Status: DC
  Administered 2014-05-30 – 2014-06-04 (×19): 4 mg via INTRAVENOUS
  Filled 2014-05-30 (×21): qty 1

## 2014-05-30 MED ORDER — SODIUM CHLORIDE 0.9 % IV BOLUS (SEPSIS)
1000.0000 mL | Freq: Once | INTRAVENOUS | Status: AC
  Administered 2014-05-30: 1000 mL via INTRAVENOUS

## 2014-05-30 MED ORDER — ALPRAZOLAM 0.5 MG PO TABS
0.5000 mg | ORAL_TABLET | Freq: Two times a day (BID) | ORAL | Status: DC
  Administered 2014-05-30 – 2014-06-03 (×7): 0.5 mg via ORAL
  Filled 2014-05-30 (×8): qty 1

## 2014-05-30 MED ORDER — PAROXETINE HCL 20 MG PO TABS
20.0000 mg | ORAL_TABLET | Freq: Every day | ORAL | Status: DC
  Administered 2014-05-31 – 2014-06-02 (×3): 20 mg via ORAL
  Filled 2014-05-30 (×4): qty 1

## 2014-05-30 MED ORDER — NITROGLYCERIN 0.4 MG SL SUBL: 0.4000 mg | SUBLINGUAL_TABLET | SUBLINGUAL | Status: DC | PRN

## 2014-05-30 MED ORDER — ASPIRIN EC 81 MG PO TBEC
81.0000 mg | DELAYED_RELEASE_TABLET | Freq: Every day | ORAL | Status: DC
  Administered 2014-05-30 – 2014-06-02 (×4): 81 mg via ORAL
  Filled 2014-05-30 (×5): qty 1

## 2014-05-30 MED ORDER — SIMVASTATIN 40 MG PO TABS: 40.0000 mg | ORAL_TABLET | Freq: Every day | ORAL | Status: DC

## 2014-05-30 MED ORDER — PANTOPRAZOLE SODIUM 40 MG IV SOLR
40.0000 mg | Freq: Every day | INTRAVENOUS | Status: DC
  Administered 2014-05-30 – 2014-06-01 (×3): 40 mg via INTRAVENOUS
  Filled 2014-05-30 (×4): qty 40

## 2014-05-30 MED ORDER — AMIODARONE HCL 200 MG PO TABS
200.0000 mg | ORAL_TABLET | Freq: Every day | ORAL | Status: DC
  Administered 2014-05-31 – 2014-06-04 (×4): 200 mg via ORAL
  Filled 2014-05-30 (×5): qty 1

## 2014-05-30 MED ORDER — METOPROLOL TARTRATE 1 MG/ML IV SOLN
5.0000 mg | Freq: Once | INTRAVENOUS | Status: AC
  Administered 2014-05-30: 5 mg via INTRAVENOUS
  Filled 2014-05-30: qty 5

## 2014-05-30 MED ORDER — DIGOXIN 0.0625 MG HALF TABLET
0.0625 mg | ORAL_TABLET | Freq: Every day | ORAL | Status: DC
  Administered 2014-05-30: 0.0625 mg via ORAL
  Filled 2014-05-30: qty 1

## 2014-05-30 MED ORDER — SODIUM CHLORIDE 0.9 % IV SOLN
INTRAVENOUS | Status: DC
  Administered 2014-05-30 – 2014-06-03 (×7): via INTRAVENOUS

## 2014-05-30 MED ORDER — DIGOXIN 0.25 MG/ML IJ SOLN
0.1250 mg | Freq: Every day | INTRAMUSCULAR | Status: DC
  Administered 2014-05-30 – 2014-06-04 (×6): 0.125 mg via INTRAVENOUS
  Filled 2014-05-30 (×9): qty 0.5

## 2014-05-30 MED ORDER — AMIODARONE HCL 200 MG PO TABS
200.0000 mg | ORAL_TABLET | Freq: Two times a day (BID) | ORAL | Status: DC
  Administered 2014-05-30: 200 mg via ORAL

## 2014-05-30 NOTE — ED Provider Notes (Signed)
CSN: [id number]     Arrival date & time 05/30/14  1601 History   First MD Initiated Contact with Patient 05/30/14 [phone]     Chief Complaint  Patient presents with  . Weakness    Patient is a 64 y.o. male presenting with weakness. The history is provided by the patient. No language interpreter was used.  Weakness   Erik Blake presents for evaluation of weakness.  He was discharged from the hospital a week ago after new diagnoses of atrial fibrillation and metastatic cancer.  Since hospital discharge he has had progressive generalized weakness.  He has not been able to stand or get up for the last two days.  He has not been able to eat or drink for the last month (this is gradually progressive).  He feels like food gets stuck in his throat and it comes back up.  He coughs and vomits up a "paste."  He has SOB with minimal activity.  Sxs are severe, constant, worsening.  Hx is provided by pt and his ex wife.  She is concerned that he is becoming confused.    Past Medical History  Diagnosis Date  . Hypertension 2012  . Myocardial infarction 2003  . Hyperlipidemia 2012  . Achalasia of esophagus   . Tobacco abuse   . CAD (coronary artery disease)   . Weight loss 05/2014    Approx 50 lbs  . GERD (gastroesophageal reflux disease)   . Metastatic cancer    Past Surgical History  Procedure Laterality Date  . Coronary artery bypass graft  2003, 2015   . Left heart catheterization with coronary angiogram N/A 02/28/2012    Procedure: LEFT HEART CATHETERIZATION WITH CORONARY ANGIOGRAM;  Surgeon: Clent Demark, MD;  Location: [facility] CATH LAB;  Service: Cardiovascular;  Laterality: N/A;  . Percutaneous coronary stent intervention (pci-s) N/A 06/05/2012    Procedure: PERCUTANEOUS CORONARY STENT INTERVENTION (PCI-S);  Surgeon: Clent Demark, MD;  Location: [facility] CATH LAB;  Service: Cardiovascular;  Laterality: N/A;  . Esophagogastroduodenoscopy N/A 05/21/2014    Procedure: ESOPHAGOGASTRODUODENOSCOPY (EGD);   Surgeon: Wonda Horner, MD;  Location: [facility] ENDOSCOPY;  Service: Endoscopy;  Laterality: N/A;  . Video bronchoscopy N/A 05/22/2014    Procedure: VIDEO BRONCHOSCOPY WITH FLUORO;  Surgeon: Wilhelmina Mcardle, MD;  Location: [facility] ENDOSCOPY;  Service: Cardiopulmonary;  Laterality: N/A;   Family History  Problem Relation Age of Onset  . Diabetes Mother   . Heart disease Mother   . Heart disease Father   . Diabetes Father   . Diabetes Sister     4 sisters   . Diabetes Brother   . Cancer Neg Hx    History  Substance Use Topics  . Smoking status: Current Every Day Smoker  . Smokeless tobacco: Never Used  . Alcohol Use: No    Review of Systems  Neurological: Positive for weakness.  All other systems reviewed and are negative.     Allergies  Isosorbide  Home Medications   Prior to Admission medications   Medication Sig Start Date End Date Taking? Authorizing Provider  ALPRAZolam Duanne Moron) 0.5 MG tablet Take 0.5 mg by mouth 2 (two) times daily.    Historical Provider, MD  amiodarone (PACERONE) 200 MG tablet Take 1 tablet (200 mg total) by mouth 2 (two) times daily. Decrease to once daily after 1 week. 05/24/14   Birdie Riddle, MD  apixaban (ELIQUIS) 5 MG TABS tablet Take 1 tablet (5 mg total) by mouth 2 (two) times daily.  05/24/14   Birdie Riddle, MD  aspirin 81 MG tablet Take 81 mg by mouth daily.    Historical Provider, MD  digoxin (LANOXIN) 0.125 MG tablet Take 0.5 tablets (0.0625 mg total) by mouth daily. 05/24/14   Birdie Riddle, MD  levofloxacin (LEVAQUIN) 500 MG tablet Take 1 tablet (500 mg total) by mouth daily. 05/24/14   Birdie Riddle, MD  metoprolol succinate (TOPROL-XL) 50 MG 24 hr tablet Take 50 mg by mouth daily. Take with or immediately following a meal.    Historical Provider, MD  nitroGLYCERIN (NITROSTAT) 0.4 MG SL tablet Place 1 tablet (0.4 mg total) under the tongue every 5 (five) minutes as needed (for chest pain). 05/24/14   Birdie Riddle, MD  pantoprazole (PROTONIX) 40 MG  tablet Take 1 tablet (40 mg total) by mouth at bedtime. 05/24/14   Birdie Riddle, MD  PARoxetine (PAXIL) 20 MG tablet Take 20 mg by mouth daily.    Historical Provider, MD  simvastatin (ZOCOR) 40 MG tablet Take 40 mg by mouth every morning.     Historical Provider, MD   BP 99/70 mmHg  Pulse 106  Temp(Src) 97.4 F (36.3 C) (Oral)  Resp 27  SpO2 99% Physical Exam  Constitutional:  Ill appearing  HENT:  Head: Normocephalic and atraumatic.  Cardiovascular:  No murmur heard. Tachycardic, irregular rhythm  Pulmonary/Chest: Effort normal and breath sounds normal. No respiratory distress.  Abdominal: Soft. There is no rebound and no guarding.  Mild diffuse abdominal tenderness  Musculoskeletal: He exhibits no edema or tenderness.  Neurological: He is alert.  Generalized weakness, mildly confused  Skin: Skin is warm and dry.  icteric  Psychiatric: He has a normal mood and affect. His behavior is normal.  Nursing note and vitals reviewed.   ED Course  Procedures (including critical care time) Labs Review Labs Reviewed  COMPREHENSIVE METABOLIC PANEL - Abnormal; Notable for the following:    Sodium 132 (*)    BUN 47 (*)    Albumin 2.4 (*)    AST 278 (*)    ALT 105 (*)    Alkaline Phosphatase 543 (*)    Total Bilirubin 9.5 (*)    GFR calc non Af Amer 69 (*)    GFR calc Af Amer 80 (*)    All other components within normal limits  CBC WITH DIFFERENTIAL/PLATELET - Abnormal; Notable for the following:    WBC 18.3 (*)    RDW 19.3 (*)    Platelets 103 (*)    Neutrophils Relative % 84 (*)    Neutro Abs 15.4 (*)    Lymphocytes Relative 5 (*)    Monocytes Absolute 1.6 (*)    All other components within normal limits  AMMONIA - Abnormal; Notable for the following:    Ammonia 51 (*)    All other components within normal limits  URINALYSIS, ROUTINE W REFLEX MICROSCOPIC - Abnormal; Notable for the following:    Color, Urine ORANGE (*)    APPearance CLOUDY (*)    Bilirubin Urine  LARGE (*)    Urobilinogen, UA 4.0 (*)    Nitrite POSITIVE (*)    Leukocytes, UA SMALL (*)    All other components within normal limits  DIGOXIN LEVEL - Abnormal; Notable for the following:    Digoxin Level 0.3 (*)    All other components within normal limits  URINE MICROSCOPIC-ADD ON - Abnormal; Notable for the following:    Bacteria, UA FEW (*)    Casts HYALINE  CASTS (*)    All other components within normal limits  URINE CULTURE  LIPASE, BLOOD  TROPONIN I    Imaging Review No results found.   EKG Interpretation   Date/Time:  Friday May 30 2014 08:02:53 EST Ventricular Rate:  118 PR Interval:    QRS Duration: 115 QT Interval:  354 QTC Calculation: 496 R Axis:   -85 Text Interpretation:  Atrial fibrillation Left anterior fascicular block  Low voltage, extremity leads Nonspecific T abnormalities, lateral leads  Confirmed by Hazle Coca [phone]) on 05/30/2014 8:25:58 AM      MDM   Final diagnoses:  Non-intractable vomiting with nausea, vomiting of unspecified type  Dehydration  Asthenia    Patient with recent diagnosis of metastatic cancer here with inability to tolerate oral fluids. Patient is debilitated and dehydrated on exam with generalized weakness. There is leukocytosis but no evidence of acute bacterial infection.  BUN and bilirubin elevated from priors.  Discussed with Dr. Terrence Dupont regarding admission for IV fluids with further workup of his vomiting and treatment for his brain metastases.  Quintella Reichert, MD 05/30/14 1158

## 2014-05-30 NOTE — H&P (Signed)
Dr. Terrence Dupont paged. BP 102/75 after 500 cc NS bolus given. HR ranging between 110-130's. Per MD to give Lopressor IV  5 mg over the span of 10 minutes. Will carry out order and recheck BP and HR.

## 2014-05-30 NOTE — Progress Notes (Addendum)
  Radiation Oncology         [phone]) [phone] ________________________________  Name: Erik Blake MRN: [id number]  Date: 05/30/2014  DOB: 1950/05/03  Chart Note:  This gentleman was diagnosed with numerous brain metastases and esophageal achalasia. He left the hospital last week without a prescription for dexamethasone. His nutritional issues between cerebral edema mediated nausea and achalasia have led to significant weight loss and malnutrition.  The patient would benefit from initiation of dexamethasone immediately. Over the course of the next 12-24 hours, we should be able to see whether he shows some improvement with dexamethasone.  I took the liberty of ordering 10 mg of IV dexamethasone now and 4 mg of dexamethasone IV every 6 hours. This can be rapidly switched over to oral dexamethasone once the patient is able to swallow and retained food and medications.  The patient also needs treatment for malnutrition. He has had barium swallow showing achalasia.  On 05/21/2014, he had upper endoscopy showing no obstruction of the esophagus but a description of muscular spasms causing difficulty passing through the lower esophagus.  I wonder whether Reglan would be beneficial in this setting. A trial of Reglan, calcium channel blockers and/or nitrates could also be considered over the next 12-24 hours to assess for any benefit.  I am hopeful that with a few conservative medical interventions as above, we can help stabilize the patient's rapidly declining course, and begin managing his metastatic cancer initially with whole brain radiotherapy, possible palliative thoracic radiotherapy and subsequent systemic therapy.  ________________________________  Sheral Apley Tammi Klippel, M.D.

## 2014-05-30 NOTE — Progress Notes (Signed)
Called Dr. Zenia Resides office regarding Erik Blake heart rate. AFIB HR as high as 160 but sustaining in low 120's. Heart Meds just given due to pharmacy reconciliation and sending up of meds. HR trending down but still in AFIB. Will continue to monitor.

## 2014-05-30 NOTE — Consult Note (Signed)
[facility] CONS[facility] Reason for consult: vomiting Referring Physician: Dr. Azalee Course is an 64 y.o. male.  HPI: he was recently hospitalized and found to have metastatic lung cancer. He has brain mets and diffuse lesions in the long probably adenocarcinoma. X-ray suggested achalasia of the esophagus. Initially there was question he could have esophageal cancer has a primary EGD a little over a week ago by Dr Penelope Coop did not show gross hair fluid levels in the esophagus and no signs of an esophageal gastric primary. Barium swallow and CT have suggested achalasia. The CT scan showed diffuse metastatic lymphadenopathy in the mediastinum and abdomen as well as ascites and diffuse metastasis to the lungs and liver. He also has diffuse metastasis to the brain. The patient was seen by oncology as well as radiotherapy but apparently decided to leave the hospital AMA. He is readmitted because he is unable to eat or drink. He is an atrial fib the rapid response and vomiting. Were asked to see him to see what can be done for his achalasia.  Past Medical History  Diagnosis Date  . Hypertension 2012  . Myocardial infarction 2003  . Hyperlipidemia 2012  . Achalasia of esophagus   . Tobacco abuse   . CAD (coronary artery disease)   . Weight loss 05/2014    Approx 50 lbs  . GERD (gastroesophageal reflux disease)   . Metastatic cancer     Past Surgical History  Procedure Laterality Date  . Coronary artery bypass graft  2003, 2015   . Left heart catheterization with coronary angiogram N/A 02/28/2012    Procedure: LEFT HEART CATHETERIZATION WITH CORONARY ANGIOGRAM;  Surgeon: Clent Demark, MD;  Location: [facility] CATH LAB;  Service: Cardiovascular;  Laterality: N/A;  . Percutaneous coronary stent intervention (pci-s) N/A 06/05/2012    Procedure: PERCUTANEOUS CORONARY STENT INTERVENTION (PCI-S);  Surgeon: Clent Demark, MD;  Location: [facility] CATH LAB;  Service: Cardiovascular;  Laterality: N/A;   . Esophagogastroduodenoscopy N/A 05/21/2014    Procedure: ESOPHAGOGASTRODUODENOSCOPY (EGD);  Surgeon: Wonda Horner, MD;  Location: [facility] ENDOSCOPY;  Service: Endoscopy;  Laterality: N/A;  . Video bronchoscopy N/A 05/22/2014    Procedure: VIDEO BRONCHOSCOPY WITH FLUORO;  Surgeon: Wilhelmina Mcardle, MD;  Location: [facility] ENDOSCOPY;  Service: Cardiopulmonary;  Laterality: N/A;    Family History  Problem Relation Age of Onset  . Diabetes Mother   . Heart disease Mother   . Heart disease Father   . Diabetes Father   . Diabetes Sister     4 sisters   . Diabetes Brother   . Cancer Neg Hx     Social History:  reports that he has been smoking.  He has never used smokeless tobacco. He reports that he does not drink alcohol or use illicit drugs.  Allergies:  Allergies  Allergen Reactions  . Isosorbide Swelling    Swelling of left side of face    Medications; Prior to Admission medications   Medication Sig Start Date End Date Taking? Authorizing Provider  ALPRAZolam Duanne Moron) 0.5 MG tablet Take 0.5 mg by mouth 2 (two) times daily.   Yes Historical Provider, MD  amiodarone (PACERONE) 200 MG tablet Take 1 tablet (200 mg total) by mouth 2 (two) times daily. Decrease to once daily after 1 week. 05/24/14  Yes Birdie Riddle, MD  apixaban (ELIQUIS) 5 MG TABS tablet Take 1 tablet (5 mg total) by mouth 2 (two) times daily. 05/24/14  Yes Birdie Riddle, MD  aspirin  81 MG tablet Take 81 mg by mouth daily.   Yes Historical Provider, MD  digoxin (LANOXIN) 0.125 MG tablet Take 0.5 tablets (0.0625 mg total) by mouth daily. 05/24/14  Yes Birdie Riddle, MD  esomeprazole (NEXIUM) 40 MG capsule Take 40 mg by mouth daily with breakfast.    Yes Historical Provider, MD  metoprolol succinate (TOPROL-XL) 50 MG 24 hr tablet Take 50 mg by mouth daily with breakfast. Take with or immediately following a meal.   Yes Historical Provider, MD  nitroGLYCERIN (NITROSTAT) 0.4 MG SL tablet Place 1 tablet (0.4 mg total) under the tongue  every 5 (five) minutes as needed (for chest pain). 05/24/14  Yes Birdie Riddle, MD  PARoxetine (PAXIL) 20 MG tablet Take 20 mg by mouth daily with breakfast.    Yes Historical Provider, MD  simvastatin (ZOCOR) 40 MG tablet Take 40 mg by mouth every morning.    Yes Historical Provider, MD   . ALPRAZolam  0.5 mg Oral BID  . [START ON 05/31/2014] amiodarone  200 mg Oral Daily  . apixaban  5 mg Oral BID  . aspirin EC  81 mg Oral Daily  . dexamethasone  4 mg Intravenous 4 times per day  . digoxin  0.125 mg Intravenous Daily  . pantoprazole (PROTONIX) IV  40 mg Intravenous QHS  . [START ON 05/31/2014] PARoxetine  20 mg Oral Daily   PRN Meds metoprolol, nitroGLYCERIN Results for orders placed or performed during the hospital encounter of 05/30/14 (from the past 48 hour(s))  Comprehensive metabolic panel     Status: Abnormal   Collection Time: 05/30/14  8:18 AM  Result Value Ref Range   Sodium 132 (L) 135 - 145 mmol/L   Potassium 4.3 3.5 - 5.1 mmol/L   Chloride 99 96 - 112 mmol/L   CO2 24 19 - 32 mmol/L   Glucose, Bld 91 70 - 99 mg/dL   BUN 47 (H) 6 - 23 mg/dL   Creatinine, Ser 1.11 0.50 - 1.35 mg/dL   Calcium 9.2 8.4 - 10.5 mg/dL   Total Protein 6.2 6.0 - 8.3 g/dL   Albumin 2.4 (L) 3.5 - 5.2 g/dL   AST 278 (H) 0 - 37 U/L   ALT 105 (H) 0 - 53 U/L   Alkaline Phosphatase 543 (H) 39 - 117 U/L   Total Bilirubin 9.5 (H) 0.3 - 1.2 mg/dL   GFR calc non Af Amer 69 (L) >90 mL/min   GFR calc Af Amer 80 (L) >90 mL/min    Comment: (NOTE) The eGFR has been calculated using the CKD EPI equation. This calculation has not been validated in all clinical situations. eGFR's persistently <90 mL/min signify possible Chronic Kidney Disease.    Anion gap 9 5 - 15  Lipase, blood     Status: None   Collection Time: 05/30/14  8:18 AM  Result Value Ref Range   Lipase 31 11 - 59 U/L  Troponin I     Status: None   Collection Time: 05/30/14  8:18 AM  Result Value Ref Range   Troponin I 0.03 <0.031 ng/mL     Comment:        NO INDICATION OF MYOCARDIAL INJURY.   CBC with Differential     Status: Abnormal   Collection Time: 05/30/14  8:18 AM  Result Value Ref Range   WBC 18.3 (H) 4.0 - 10.5 K/uL   RBC 5.26 4.22 - 5.81 MIL/uL   Hemoglobin 16.3 13.0 - 17.0 g/dL  HCT 46.9 39.0 - 52.0 %   MCV 89.2 78.0 - 100.0 fL   MCH 31.0 26.0 - 34.0 pg   MCHC 34.8 30.0 - 36.0 g/dL   RDW 19.3 (H) 11.5 - 15.5 %   Platelets 103 (L) 150 - 400 K/uL    Comment: SPECIMEN CHECKED FOR CLOTS PLATELET COUNT CONFIRMED BY SMEAR    Neutrophils Relative % 84 (H) 43 - 77 %   Neutro Abs 15.4 (H) 1.7 - 7.7 K/uL   Lymphocytes Relative 5 (L) 12 - 46 %   Lymphs Abs 0.9 0.7 - 4.0 K/uL   Monocytes Relative 9 3 - 12 %   Monocytes Absolute 1.6 (H) 0.1 - 1.0 K/uL   Eosinophils Relative 2 0 - 5 %   Eosinophils Absolute 0.4 0.0 - 0.7 K/uL   Basophils Relative 0 0 - 1 %   Basophils Absolute 0.0 0.0 - 0.1 K/uL  Ammonia     Status: Abnormal   Collection Time: 05/30/14  8:18 AM  Result Value Ref Range   Ammonia 51 (H) 11 - 32 umol/L  Digoxin level     Status: Abnormal   Collection Time: 05/30/14  8:18 AM  Result Value Ref Range   Digoxin Level 0.3 (L) 0.8 - 2.0 ng/mL  Urinalysis, Routine w reflex microscopic     Status: Abnormal   Collection Time: 05/30/14  9:01 AM  Result Value Ref Range   Color, Urine ORANGE (A) YELLOW    Comment: BIOCHEMICALS MAY BE AFFECTED BY COLOR   APPearance CLOUDY (A) CLEAR   Specific Gravity, Urine 1.024 1.005 - 1.030   pH 5.5 5.0 - 8.0   Glucose, UA NEGATIVE NEGATIVE mg/dL   Hgb urine dipstick NEGATIVE NEGATIVE   Bilirubin Urine LARGE (A) NEGATIVE   Ketones, ur NEGATIVE NEGATIVE mg/dL   Protein, ur NEGATIVE NEGATIVE mg/dL   Urobilinogen, UA 4.0 (H) 0.0 - 1.0 mg/dL   Nitrite POSITIVE (A) NEGATIVE   Leukocytes, UA SMALL (A) NEGATIVE  Urine microscopic-add on     Status: Abnormal   Collection Time: 05/30/14  9:01 AM  Result Value Ref Range   WBC, UA 3-6 <3 WBC/hpf   RBC / HPF 0-2 <3  RBC/hpf   Bacteria, UA FEW (A) RARE   Casts HYALINE CASTS (A) NEGATIVE    Comment: GRANULAR CAST    No results found. ROS: Constitutional: HEENT: Cardiovascular: Respiratory: GI: patient reports he is unable to keep down liquids or solids but denies abdominal pain or odynophagia GU: Musculoskeletal: Neuro/Psychiatric: Endocrine/Heme:            Blood pressure 111/66, pulse 136, temperature 97.5 F (36.4 C), temperature source Oral, resp. rate 18, height _0  (1.727 m), weight 78.4 kg (172 lb 13.5 oz), SpO2 94 %.  Physical exam:   General--vomiting clear material into an emesis basin ENT-- no bleeding  Heart-- regular rate Lungs--grossly clear Abdomen-- none distended and soft.    Assessment: 1. Nausea and vomiting/probable achalasia. This is not been diagnosed yet by manometry but negative EGD and the barium swallow and CT or suggestive. The patient did not have diffusely metastatic cancer I think more aggressive evaluation and consideration for laparoscopic myotomy or Botox injection would be appropriate. 2. Diffusely metastatic adenocarcinoma the lung.  Plan: 1. Would consider what the approaches to be for this gentleman. 2. If he is to receive radiation and chemotherapy, I would consider placement of PEG tube to facilitate nutrition. His alternative we could try Botox injection to the  esophageal sphincter but we would need to confirm diagnosed with manometry 1st.   Casmere Hollenbeck JR,Viet Kemmerer L 05/30/2014, 7:24 PM

## 2014-05-30 NOTE — H&P (Signed)
Erik Blake is an 64 y.o. male.   Chief Complaint: Generalized weakness and inability to get out of bed and go and poor appetite associated with nausea vomiting HPI: Patient is 64 year old male with past medical history significant for metastatic adenocarcinoma of the lung status post bronchoscopy/biopsy with metastases to brain and liver, coronary artery disease history of MI 3 in the past status post CABG, hypertension, chronic atrial fibrillation, achalasia distal esophagus, and history of tobacco abuse 50 pack years, severe protein calorie malnutrition, depression, came to the ER complaining of generalized weakness and inability to get out of bed and poor appetite associated with nausea and vomiting for last 1 week. Patient was in the hospital approximately 10 days ago and signed out [place]. Patient states he is not able to eat anything and is getting progressively weak. Denies any chest pain and any palpitations. Denies shortness of breath. Patient was noted to be in A. fib with RVR and dehydrated in the ED.  Past Medical History  Diagnosis Date  . Hypertension 2012  . Myocardial infarction 2003  . Hyperlipidemia 2012  . Achalasia of esophagus   . Tobacco abuse   . CAD (coronary artery disease)   . Weight loss 05/2014    Approx 50 lbs  . GERD (gastroesophageal reflux disease)   . Metastatic cancer     Past Surgical History  Procedure Laterality Date  . Coronary artery bypass graft  2003, 2015   . Left heart catheterization with coronary angiogram N/A 02/28/2012    Procedure: LEFT HEART CATHETERIZATION WITH CORONARY ANGIOGRAM;  Surgeon: Clent Demark, MD;  Location: [facility] CATH LAB;  Service: Cardiovascular;  Laterality: N/A;  . Percutaneous coronary stent intervention (pci-s) N/A 06/05/2012    Procedure: PERCUTANEOUS CORONARY STENT INTERVENTION (PCI-S);  Surgeon: Clent Demark, MD;  Location: [facility] CATH LAB;  Service: Cardiovascular;  Laterality: N/A;  .  Esophagogastroduodenoscopy N/A 05/21/2014    Procedure: ESOPHAGOGASTRODUODENOSCOPY (EGD);  Surgeon: Wonda Horner, MD;  Location: [facility] ENDOSCOPY;  Service: Endoscopy;  Laterality: N/A;  . Video bronchoscopy N/A 05/22/2014    Procedure: VIDEO BRONCHOSCOPY WITH FLUORO;  Surgeon: Wilhelmina Mcardle, MD;  Location: [facility] ENDOSCOPY;  Service: Cardiopulmonary;  Laterality: N/A;    Family History  Problem Relation Age of Onset  . Diabetes Mother   . Heart disease Mother   . Heart disease Father   . Diabetes Father   . Diabetes Sister     4 sisters   . Diabetes Brother   . Cancer Neg Hx    Social History:  reports that he has been smoking.  He has never used smokeless tobacco. He reports that he does not drink alcohol or use illicit drugs.  Allergies:  Allergies  Allergen Reactions  . Isosorbide Swelling    Swelling of left side of face    Medications Prior to Admission  Medication Sig Dispense Refill  . ALPRAZolam (XANAX) 0.5 MG tablet Take 0.5 mg by mouth 2 (two) times daily.    Marland Kitchen amiodarone (PACERONE) 200 MG tablet Take 1 tablet (200 mg total) by mouth 2 (two) times daily. Decrease to once daily after 1 week. 60 tablet 1  . apixaban (ELIQUIS) 5 MG TABS tablet Take 1 tablet (5 mg total) by mouth 2 (two) times daily. 60 tablet 1  . aspirin 81 MG tablet Take 81 mg by mouth daily.    . digoxin (LANOXIN) 0.125 MG tablet Take 0.5 tablets (0.0625 mg total) by mouth daily. 15 tablet  1  . esomeprazole (NEXIUM) 40 MG capsule Take 40 mg by mouth daily with breakfast.     . metoprolol succinate (TOPROL-XL) 50 MG 24 hr tablet Take 50 mg by mouth daily with breakfast. Take with or immediately following a meal.    . nitroGLYCERIN (NITROSTAT) 0.4 MG SL tablet Place 1 tablet (0.4 mg total) under the tongue every 5 (five) minutes as needed (for chest pain). 25 tablet 2  . PARoxetine (PAXIL) 20 MG tablet Take 20 mg by mouth daily with breakfast.     . simvastatin (ZOCOR) 40 MG tablet Take 40 mg by mouth every  morning.     . [DISCONTINUED] levofloxacin (LEVAQUIN) 500 MG tablet Take 1 tablet (500 mg total) by mouth daily. (Patient not taking: Reported on 05/30/2014) 5 tablet 0  . [DISCONTINUED] pantoprazole (PROTONIX) 40 MG tablet Take 1 tablet (40 mg total) by mouth at bedtime. (Patient not taking: Reported on 05/30/2014) 30 tablet 1    Results for orders placed or performed during the hospital encounter of 05/30/14 (from the past 48 hour(s))  Comprehensive metabolic panel     Status: Abnormal   Collection Time: 05/30/14  8:18 AM  Result Value Ref Range   Sodium 132 (L) 135 - 145 mmol/L   Potassium 4.3 3.5 - 5.1 mmol/L   Chloride 99 96 - 112 mmol/L   CO2 24 19 - 32 mmol/L   Glucose, Bld 91 70 - 99 mg/dL   BUN 47 (H) 6 - 23 mg/dL   Creatinine, Ser 1.11 0.50 - 1.35 mg/dL   Calcium 9.2 8.4 - 10.5 mg/dL   Total Protein 6.2 6.0 - 8.3 g/dL   Albumin 2.4 (L) 3.5 - 5.2 g/dL   AST 278 (H) 0 - 37 U/L   ALT 105 (H) 0 - 53 U/L   Alkaline Phosphatase 543 (H) 39 - 117 U/L   Total Bilirubin 9.5 (H) 0.3 - 1.2 mg/dL   GFR calc non Af Amer 69 (L) >90 mL/min   GFR calc Af Amer 80 (L) >90 mL/min    Comment: (NOTE) The eGFR has been calculated using the CKD EPI equation. This calculation has not been validated in all clinical situations. eGFR's persistently <90 mL/min signify possible Chronic Kidney Disease.    Anion gap 9 5 - 15  Lipase, blood     Status: None   Collection Time: 05/30/14  8:18 AM  Result Value Ref Range   Lipase 31 11 - 59 U/L  Troponin I     Status: None   Collection Time: 05/30/14  8:18 AM  Result Value Ref Range   Troponin I 0.03 <0.031 ng/mL    Comment:        NO INDICATION OF MYOCARDIAL INJURY.   CBC with Differential     Status: Abnormal   Collection Time: 05/30/14  8:18 AM  Result Value Ref Range   WBC 18.3 (H) 4.0 - 10.5 K/uL   RBC 5.26 4.22 - 5.81 MIL/uL   Hemoglobin 16.3 13.0 - 17.0 g/dL   HCT 46.9 39.0 - 52.0 %   MCV 89.2 78.0 - 100.0 fL   MCH 31.0 26.0 - 34.0 pg    MCHC 34.8 30.0 - 36.0 g/dL   RDW 19.3 (H) 11.5 - 15.5 %   Platelets 103 (L) 150 - 400 K/uL    Comment: SPECIMEN CHECKED FOR CLOTS PLATELET COUNT CONFIRMED BY SMEAR    Neutrophils Relative % 84 (H) 43 - 77 %   Neutro Abs 15.4 (  H) 1.7 - 7.7 K/uL   Lymphocytes Relative 5 (L) 12 - 46 %   Lymphs Abs 0.9 0.7 - 4.0 K/uL   Monocytes Relative 9 3 - 12 %   Monocytes Absolute 1.6 (H) 0.1 - 1.0 K/uL   Eosinophils Relative 2 0 - 5 %   Eosinophils Absolute 0.4 0.0 - 0.7 K/uL   Basophils Relative 0 0 - 1 %   Basophils Absolute 0.0 0.0 - 0.1 K/uL  Ammonia     Status: Abnormal   Collection Time: 05/30/14  8:18 AM  Result Value Ref Range   Ammonia 51 (H) 11 - 32 umol/L  Digoxin level     Status: Abnormal   Collection Time: 05/30/14  8:18 AM  Result Value Ref Range   Digoxin Level 0.3 (L) 0.8 - 2.0 ng/mL  Urinalysis, Routine w reflex microscopic     Status: Abnormal   Collection Time: 05/30/14  9:01 AM  Result Value Ref Range   Color, Urine ORANGE (A) YELLOW    Comment: BIOCHEMICALS MAY BE AFFECTED BY COLOR   APPearance CLOUDY (A) CLEAR   Specific Gravity, Urine 1.024 1.005 - 1.030   pH 5.5 5.0 - 8.0   Glucose, UA NEGATIVE NEGATIVE mg/dL   Hgb urine dipstick NEGATIVE NEGATIVE   Bilirubin Urine LARGE (A) NEGATIVE   Ketones, ur NEGATIVE NEGATIVE mg/dL   Protein, ur NEGATIVE NEGATIVE mg/dL   Urobilinogen, UA 4.0 (H) 0.0 - 1.0 mg/dL   Nitrite POSITIVE (A) NEGATIVE   Leukocytes, UA SMALL (A) NEGATIVE  Urine microscopic-add on     Status: Abnormal   Collection Time: 05/30/14  9:01 AM  Result Value Ref Range   WBC, UA 3-6 <3 WBC/hpf   RBC / HPF 0-2 <3 RBC/hpf   Bacteria, UA FEW (A) RARE   Casts HYALINE CASTS (A) NEGATIVE    Comment: GRANULAR CAST   No results found.  Review of Systems  Constitutional: Positive for malaise/fatigue. Negative for fever and chills.  Eyes: Negative for double vision.  Cardiovascular: Negative for chest pain and palpitations.  Gastrointestinal: Positive for  nausea and vomiting.  Genitourinary: Negative for dysuria.  Neurological: Positive for dizziness and weakness.    Blood pressure 107/73, pulse 87, temperature 97.5 F (36.4 C), temperature source Oral, resp. rate 18, height 5' 8"  (1.727 m), weight 78.4 kg (172 lb 13.5 oz), SpO2 94 %. Physical Exam  Constitutional: He is oriented to person, place, and time.  HENT:  Head: Normocephalic and atraumatic.  Eyes: Conjunctivae are normal. Scleral icterus is present.  Neck: Normal range of motion. Neck supple. No tracheal deviation present. No thyromegaly present.  Cardiovascular:  Irregularly irregular tachycardic S1 and S2 soft  Respiratory:  Decreased breath sound at bases  GI: Soft. Bowel sounds are normal.  Musculoskeletal: He exhibits no edema or tenderness.  Neurological: He is alert and oriented to person, place, and time.     Assessment/Plan Metastatic CA of the lung with metastases to brain and liver A. fib with RVR Elevated LFTs secondary to 1 Failure to thrive/protein calorie malnutrition CAD history of MI 3 status post CABG in the past status post PCI to protected left main in the past Hypertension Prediabetic Achalasia of distal esophagus History of tobacco abuse approximately 50-pack-years Depression  Plan As per orders Long-term prognosis poor Will discuss with family and patient regarding CODE STATUS and palliative care consult  Clent Demark 05/30/2014, 3:30 PM

## 2014-05-30 NOTE — Progress Notes (Signed)
INITIAL NUTRITION ASSESSMENT  DOCUMENTATION CODES Per approved criteria  -Severe malnutrition in the context of chronic illness   Pt meets criteria for severe MALNUTRITION in the context of chronic illness as evidenced by 27% loss of body weight in the past 6 months, >75% PO intake for the past 3-4 months and severe depletion of fat and muscle mass.   INTERVENTION: -Recommend SLP consult -If able take PO's recommend Resource Breeze BID when diet advanced providing 250 kcal and 9 g protein -If not able take PO's when diet advanced recommend initiation of nutrition support  NUTRITION DIAGNOSIS: Inadequate oral intake related to esophageal achalasia as evidenced by difficulty swallowing.   Goal: Pt to meet >/= 90% of estimated needs  Monitor:  Pt PO intake, weight trends, labs  Reason for Assessment: MST = 3   64 y.o. male  Admitting Dx: <principal problem not specified>  ASSESSMENT: Pt with numerous brain metastases and esophageal achalasia. Past medical history of CAD, MI x3 post CABG, HTN, impaired glucose tolerance, tobacco abuse.   Pt reports usual body weight is 230 lbs.  Reports for the past 3-4 months he has not been able to eat much because of difficulty swallowing and if it gets down it comes right back up.  Per weight records pt has lost 27% of body weight in the past 6 months (significant for time frame).   Pt willing to try Resource Breeze when advanced to a diet.   Recommend SLP consult.    Nutrition Focused Physical Exam:  Subcutaneous Fat:  Orbital Region: severe depletion Upper Arm Region: severe depletion Thoracic and Lumbar Region: n/a  Muscle:  Temple Region: severe depletion Clavicle Bone Region: severe depletion Clavicle and Acromion Bone Region: n/a Scapular Bone Region: n/a Dorsal Hand: n/a Patellar Region: n/a Anterior Thigh Region: n/a Posterior Calf Region: n/a  Edema: n/a    Height: Ht Readings from Last 1 Encounters:  05/30/14 5'  8" (1.727 m)    Weight: Wt Readings from Last 1 Encounters:  05/30/14 172 lb 13.5 oz (78.4 kg)    Ideal Body Weight: 154 lbs  % Ideal Body Weight: 116%  Wt Readings from Last 10 Encounters:  05/30/14 172 lb 13.5 oz (78.4 kg)  05/23/14 178 lb 2.1 oz (80.8 kg)  01/08/14 193 lb (87.544 kg)  12/23/13 197 lb (89.359 kg)  06/05/12 235 lb (106.595 kg)  02/28/12 235 lb (106.595 kg)    Usual Body Weight: 230 lbs  % Usual Body Weight: 75%  BMI:  Body mass index is 26.29 kg/(m^2).  Estimated Nutritional Needs: Kcal: 2229-7989 kcals Protein: 90-105 g protein  Fluid:  >/= 2100 ml/day  Skin: Jaundice, dry, ecchymosis  Diet Order: Diet NPO time specified  EDUCATION NEEDS: -No education needs identified at this time  No intake or output data in the 24 hours ending 05/30/14 1431  Last BM: 2/19   Labs:   Recent Labs Lab 05/30/14 0818  NA 132*  K 4.3  CL 99  CO2 24  BUN 47*  CREATININE 1.11  CALCIUM 9.2  GLUCOSE 91    CBG (last 3)  No results for input(s): GLUCAP in the last 72 hours.  Scheduled Meds: . dexamethasone  10 mg Intravenous Once  . dexamethasone  4 mg Intravenous 4 times per day    Continuous Infusions:   Past Medical History  Diagnosis Date  . Hypertension 2012  . Myocardial infarction 2003  . Hyperlipidemia 2012  . Achalasia of esophagus   . Tobacco  abuse   . CAD (coronary artery disease)   . Weight loss 05/2014    Approx 50 lbs  . GERD (gastroesophageal reflux disease)   . Metastatic cancer     Past Surgical History  Procedure Laterality Date  . Coronary artery bypass graft  2003, 2015   . Left heart catheterization with coronary angiogram N/A 02/28/2012    Procedure: LEFT HEART CATHETERIZATION WITH CORONARY ANGIOGRAM;  Surgeon: Clent Demark, MD;  Location: [facility] CATH LAB;  Service: Cardiovascular;  Laterality: N/A;  . Percutaneous coronary stent intervention (pci-s) N/A 06/05/2012    Procedure: PERCUTANEOUS CORONARY STENT  INTERVENTION (PCI-S);  Surgeon: Clent Demark, MD;  Location: [facility] CATH LAB;  Service: Cardiovascular;  Laterality: N/A;  . Esophagogastroduodenoscopy N/A 05/21/2014    Procedure: ESOPHAGOGASTRODUODENOSCOPY (EGD);  Surgeon: Wonda Horner, MD;  Location: [facility] ENDOSCOPY;  Service: Endoscopy;  Laterality: N/A;  . Video bronchoscopy N/A 05/22/2014    Procedure: VIDEO BRONCHOSCOPY WITH FLUORO;  Surgeon: Wilhelmina Mcardle, MD;  Location: [facility] ENDOSCOPY;  Service: Cardiopulmonary;  Laterality: N/A;    Elmer Picker MS Dietetic Intern Pager Number [phone]

## 2014-05-30 NOTE — ED Notes (Signed)
Pt BIB [facility], reports increased weakness, hx abdominal CA, esophageal spasms causing nausea/vomiting, also dx with pneumonia recently. Pt reports that he has been unable to eat or drink anything since being released from [facility]. Pt noted to be hypotensive, tachycardic in a fib, given 500cc NS fluid bolus en route with increase in BP, decrease of HR and pt became more alert. Pt noted to be jaundiced. Reports abdominal pain only when coughing. Pt confused during assessment but is oriented x4.

## 2014-05-30 NOTE — Progress Notes (Signed)
Dr. Terrence Dupont paged back due to pt BP. BP too low to give lopressor. Harwani gave orders to give already ordered DIgoxin and a 55ml Bolus. Lopressor held. Dig give and bolus given. Report given to night nurse.Will continue to monitor. Harwani said to give bolus and re-check BP and then give metoprolol is appropriate.

## 2014-05-30 NOTE — ED Notes (Signed)
Bed: SP23 Expected date:  Expected time:  Means of arrival:  Comments: EMS 64 yo male with cancer/weakness and fatigue

## 2014-05-31 DIAGNOSIS — C7931 Secondary malignant neoplasm of brain: Secondary | ICD-10-CM

## 2014-05-31 DIAGNOSIS — C787 Secondary malignant neoplasm of liver and intrahepatic bile duct: Secondary | ICD-10-CM

## 2014-05-31 DIAGNOSIS — R454 Irritability and anger: Secondary | ICD-10-CM

## 2014-05-31 DIAGNOSIS — C7801 Secondary malignant neoplasm of right lung: Secondary | ICD-10-CM

## 2014-05-31 DIAGNOSIS — C3490 Malignant neoplasm of unspecified part of unspecified bronchus or lung: Principal | ICD-10-CM

## 2014-05-31 DIAGNOSIS — C7802 Secondary malignant neoplasm of left lung: Secondary | ICD-10-CM

## 2014-05-31 DIAGNOSIS — R112 Nausea with vomiting, unspecified: Secondary | ICD-10-CM

## 2014-05-31 DIAGNOSIS — R630 Anorexia: Secondary | ICD-10-CM

## 2014-05-31 DIAGNOSIS — R531 Weakness: Secondary | ICD-10-CM

## 2014-05-31 LAB — URINE CULTURE
Colony Count: NO GROWTH
Culture: NO GROWTH

## 2014-05-31 LAB — COMPREHENSIVE METABOLIC PANEL
ALK PHOS: 519 U/L — AB (ref 39–117)
ALT: 102 U/L — ABNORMAL HIGH (ref 0–53)
ANION GAP: 12 (ref 5–15)
AST: 243 U/L — AB (ref 0–37)
Albumin: 2.3 g/dL — ABNORMAL LOW (ref 3.5–5.2)
BUN: 53 mg/dL — AB (ref 6–23)
CALCIUM: 9.1 mg/dL (ref 8.4–10.5)
CO2: 21 mmol/L (ref 19–32)
CREATININE: 1.13 mg/dL (ref 0.50–1.35)
Chloride: 98 mmol/L (ref 96–112)
GFR, EST AFRICAN AMERICAN: 79 mL/min — AB (ref >90)
GFR, EST NON AFRICAN AMERICAN: 68 mL/min — AB (ref >90)
Glucose, Bld: 120 mg/dL — ABNORMAL HIGH (ref 70–99)
Potassium: 4.6 mmol/L (ref 3.5–5.1)
SODIUM: 131 mmol/L — AB (ref 135–145)
Total Bilirubin: 10.6 mg/dL — ABNORMAL HIGH (ref 0.3–1.2)
Total Protein: 6 g/dL (ref 6.0–8.3)

## 2014-05-31 LAB — CBC
HEMATOCRIT: 46.2 % (ref 39.0–52.0)
Hemoglobin: 16.1 g/dL (ref 13.0–17.0)
MCH: 31.4 pg (ref 26.0–34.0)
MCHC: 34.8 g/dL (ref 30.0–36.0)
MCV: 90.2 fL (ref 78.0–100.0)
Platelets: 101 10*3/uL — ABNORMAL LOW (ref 150–400)
RBC: 5.12 MIL/uL (ref 4.22–5.81)
RDW: 19.8 % — ABNORMAL HIGH (ref 11.5–15.5)
WBC: 19.6 10*3/uL — ABNORMAL HIGH (ref 4.0–10.5)

## 2014-05-31 LAB — GLUCOSE, CAPILLARY: Glucose-Capillary: 159 mg/dL — ABNORMAL HIGH (ref 70–99)

## 2014-05-31 MED ORDER — BOOST / RESOURCE BREEZE PO LIQD
1.0000 | Freq: Two times a day (BID) | ORAL | Status: DC
  Administered 2014-05-31 – 2014-06-01 (×3): 1 via ORAL

## 2014-05-31 NOTE — Progress Notes (Signed)
Subjective:  Patient denies any chest pain or shortness of breath. Less nauseous this morning. Scheduled for brain XRT on Monday. GI evaluation for achalasia in progress. Discussed with patient and family at length regarding CODE STATUS by Dr. Jana Hakim and requested for DO NOT RESUSCITATE.  Objective:  Vital Signs in the last 24 hours: Temp:  [97.5 F (36.4 C)-97.7 F (36.5 C)] 97.5 F (36.4 C) (02/20 0447) Pulse Rate:  [87-136] 101 (02/20 0941) Resp:  [18] 18 (02/20 0447) BP: (96-130)/(64-90) 130/90 mmHg (02/20 0941) SpO2:  [88 %-94 %] 91 % (02/20 0447)  Intake/Output from previous day: 02/19 0701 - 02/20 0700 In: 1578.8 [P.O.:480; I.V.:1098.8] Out: 200 [Urine:200] Intake/Output from this shift: Total I/O In: 240 [P.O.:240] Out: -   Physical Exam: Neck: no adenopathy, no carotid bruit, no JVD and supple, symmetrical, trachea midline Lungs: Decreased breath sound at bases with occasional rhonchi Heart: irregularly irregular rhythm, S1, S2 normal and Soft systolic murmur noted Abdomen: soft, non-tender; bowel sounds normal; no masses,  no organomegaly Extremities: extremities normal, atraumatic, no cyanosis or edema  Lab Results:  Recent Labs  05/30/14 0818 05/31/14 0500  WBC 18.3* 19.6*  HGB 16.3 16.1  PLT 103* 101*    Recent Labs  05/30/14 0818 05/31/14 0500  NA 132* 131*  K 4.3 4.6  CL 99 98  CO2 24 21  GLUCOSE 91 120*  BUN 47* 53*  CREATININE 1.11 1.13    Recent Labs  05/30/14 0818  TROPONINI 0.03   Hepatic Function Panel  Recent Labs  05/31/14 0500  PROT 6.0  ALBUMIN 2.3*  AST 243*  ALT 102*  ALKPHOS 519*  BILITOT 10.6*   No results for input(s): CHOL in the last 72 hours. No results for input(s): PROTIME in the last 72 hours.  Imaging: Imaging results have been reviewed and No results found.  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 fib with moderate ventricular  response Elevated LFTs secondary to 1 Failure to thrive/protein calorie malnutrition CAD history of MI 3 status post CABG in the past status post PCI to protected left main in the past Hypertension Prediabetic Achalasia of distal esophagus History of tobacco abuse approximately 50-pack-years Depression Plan Continue present management Increase ambulation as tolerated Advance diet as tolerated  LOS: 1 day    Kaleea Penner N 05/31/2014, 1:27 PM

## 2014-05-31 NOTE — Addendum Note (Signed)
Encounter addended by: Yehuda Mao on: 05/31/2014  1:07 PM<BR>     Documentation filed: Charges VN

## 2014-05-31 NOTE — Evaluation (Signed)
Physical Therapy Evaluation Patient Details Name: Erik Blake MRN: [id number] DOB: 28-Feb-1951 Today's Date: 05/31/2014   History of Present Illness  Patient is 64 year old male with past medical history significant for metastatic adenocarcinoma of the lung status post bronchoscopy/biopsy with metastases to brain and liver, coronary artery disease history of MI 3 in the past status post CABG, hypertension, chronic atrial fibrillation, achalasia distal esophagus, and history of tobacco abuse 50 pack years, severe protein calorie malnutrition, depression, came to the ER complaining of generalized weakness and inability to get out of bed and poor appetite associated with nausea and vomiting for 1 week.   Clinical Impression  Pt admitted with above diagnosis. Pt currently with functional limitations due to the deficits listed below (see PT Problem List). * Pt will benefit from skilled PT to increase their independence and safety with mobility to allow discharge to the venue listed below.    +2 assist for bed to recliner transfer with RW. Since pt's wife works full time, pt requires +2 physical assist for transfers, and needs 24* supervision for safety due to confusion,  ST-SNF is recommended. Will follow during acute stay.  *    Follow Up Recommendations SNF    Equipment Recommendations  Wheelchair (measurements PT)    Recommendations for Other Services       Precautions / Restrictions        Mobility  Bed Mobility Overal bed mobility: Needs Assistance Bed Mobility: Supine to Sit     Supine to sit: HOB elevated;Mod assist     General bed mobility comments: assist to initiate movement and to raise trunk  Transfers Overall transfer level: Needs assistance Equipment used: Rolling walker (2 wheeled) Transfers: Sit to/from [employer] Transfers Sit to Stand: +2 physical assistance;Max assist Stand pivot transfers: +2 physical assistance;Max assist       General transfer  comment: assist to rise and steady, manual cues for hand placement on RW; +2 for safety, pt attempted to sit prior to reaching recliner  Ambulation/Gait                Stairs            Wheelchair Mobility    Modified Rankin (Stroke Patients Only)       Balance Overall balance assessment: Needs assistance   Sitting balance-Leahy Scale: Fair       Standing balance-Leahy Scale: Poor Standing balance comment: needs RW                             Pertinent Vitals/Pain Pain Assessment: No/denies pain    Home Living Family/patient expects to be discharged to:: Private residence Living Arrangements: Spouse/significant other Available Help at Discharge: Family;Available PRN/intermittently (wife works full time) Type of Home: House Home Access: Stairs to enter   [employer] of Steps: 2 Home Layout: One level Home Equipment: Toilet riser;Walker - 4 wheels;Shower seat      Prior Function           Comments: wasn't able to walk for 2 days prior to admission due to BLE weakness, walked with rollator prior to that, wife stated pt has had 3 months of LE weakness     Hand Dominance        Extremity/Trunk Assessment   Upper Extremity Assessment: Overall WFL for tasks assessed           Lower Extremity Assessment: Generalized weakness;Difficult to assess due to impaired cognition (B  knee extension +4/5)      Cervical / Trunk Assessment: Kyphotic  Communication   Communication: No difficulties  Cognition Arousal/Alertness: Lethargic Behavior During Therapy: Impulsive Overall Cognitive Status: Impaired/Different from baseline Area of Impairment: Safety/judgement;Awareness;Memory;Problem solving         Safety/Judgement: Decreased awareness of safety;Decreased awareness of deficits   Problem Solving: Requires verbal cues;Requires tactile cues      General Comments      Exercises        Assessment/Plan    PT  Assessment Patient needs continued PT services  PT Diagnosis Difficulty walking;Generalized weakness   PT Problem List Decreased strength;Decreased activity tolerance;Decreased balance;Decreased knowledge of use of DME;Decreased cognition;Decreased mobility;Decreased safety awareness  PT Treatment Interventions DME instruction;Gait training;Functional mobility training;Therapeutic activities;Patient/family education;Therapeutic exercise   PT Goals (Current goals can be found in the Care Plan section) Acute Rehab PT Goals Patient Stated Goal: to get some strength back in his legs, be able to tinker in his garage PT Goal Formulation: With family Time For Goal Achievement: 06/14/14 Potential to Achieve Goals: Fair    Frequency Min 3X/week   Barriers to discharge Decreased caregiver support wife works, pt needs 24* assist/supervision    Co-evaluation               End of Session Equipment Utilized During Treatment: Gait belt;Oxygen Activity Tolerance: Patient limited by fatigue Patient left: in chair;with call bell/phone within reach;with chair alarm set;with family/visitor present Nurse Communication: Mobility status         Time: 4401-0272 PT Time Calculation (min) (ACUTE ONLY): 31 min   Charges:   PT Evaluation $Initial PT Evaluation Tier I: 1 Procedure PT Treatments $Therapeutic Activity: 8-22 mins   PT G Codes:        Philomena Doheny 05/31/2014, 10:57 AM [phone]

## 2014-05-31 NOTE — Progress Notes (Signed)
Erik Blake   DOB:[birth year]   WY#:[id number]   YDX#:[id number]  Subjective: "had a rough night", pulled IVs and condom catheter, staff had to phone wife at 3 AM to try to calm the patient down; now he is comfortable, propped up in bed, sipping water w/o cough or regurgitation. Wife at bedside. --Long discussion regarding his situation, information given to them in writing. Discussed advanced directives and this is something they have discussed many times, his wife tells me, and both are very clear "he is in God's hands" and if he dies they do not want him kept alive on machines etc "just so he dies a second time." I explained I could write a DNR order to operatinalize those wishes and they requested that.   Objective: middle aged White man examined in bed Filed Vitals:   05/31/14 0447  BP: 99/68  Pulse: 101  Temp: 97.5 F (36.4 C)  Resp: 18    Body mass index is 26.29 kg/(m^2).  Intake/Output Summary (Last 24 hours) at 05/31/14 0852 Last data filed at 05/31/14 0850  Gross per 24 hour  Intake 1818.75 ml  Output    200 ml  Net 1618.75 ml     CBG (last 3)  No results for input(s): GLUCAP in the last 72 hours.   Labs:  Lab Results  Component Value Date   WBC 19.6* 05/31/2014   HGB 16.1 05/31/2014   HCT 46.2 05/31/2014   MCV 90.2 05/31/2014   PLT 101* 05/31/2014   NEUTROABS 15.4* 05/30/2014    @LASTCHEMISTRY @  Urine Studies No results for input(s): UHGB, CRYS in the last 72 hours.  Invalid input(s): UACOL, UAPR, USPG, UPH, UTP, UGL, UKET, UBIL, UNIT, UROB, ULEU, UEPI, UWBC, URBC, UBAC, CAST, UCOM, BILUA  Basic Metabolic Panel:  Recent Labs Lab 05/30/14 0818 05/31/14 0500  NA 132* 131*  K 4.3 4.6  CL 99 98  CO2 24 21  GLUCOSE 91 120*  BUN 47* 53*  CREATININE 1.11 1.13  CALCIUM 9.2 9.1   GFR Estimated Creatinine Clearance: 65.6 mL/min (by C-G formula based on Cr of 1.13). Liver Function Tests:  Recent Labs Lab 05/30/14 0818 05/31/14 0500  AST 278* 243*   ALT 105* 102*  ALKPHOS 543* 519*  BILITOT 9.5* 10.6*  PROT 6.2 6.0  ALBUMIN 2.4* 2.3*    Recent Labs Lab 05/30/14 0818  LIPASE 31    Recent Labs Lab 05/30/14 0818  AMMONIA 51*   Coagulation profile No results for input(s): INR, PROTIME in the last 168 hours.  CBC:  Recent Labs Lab 05/30/14 0818 05/31/14 0500  WBC 18.3* 19.6*  NEUTROABS 15.4*  --   HGB 16.3 16.1  HCT 46.9 46.2  MCV 89.2 90.2  PLT 103* 101*   Cardiac Enzymes:  Recent Labs Lab 05/30/14 0818  TROPONINI 0.03   BNP: Invalid input(s): POCBNP CBG: No results for input(s): GLUCAP in the last 168 hours. D-Dimer No results for input(s): DDIMER in the last 72 hours. Hgb A1c No results for input(s): HGBA1C in the last 72 hours. Lipid Profile No results for input(s): CHOL, HDL, LDLCALC, TRIG, CHOLHDL, LDLDIRECT in the last 72 hours. Thyroid function studies No results for input(s): TSH, T4TOTAL, T3FREE, THYROIDAB in the last 72 hours.  Invalid input(s): FREET3 Anemia work up No results for input(s): VITAMINB12, FOLATE, FERRITIN, TIBC, IRON, RETICCTPCT in the last 72 hours. Microbiology No results found for this or any previous visit (from the past 240 hour(s)).    Studies:  Ct Angio  Chest Pe W/cm &/or Wo Cm  05/19/2014   CLINICAL DATA:  Shortness of Breath  EXAM: CT ANGIOGRAPHY CHEST WITH CONTRAST  TECHNIQUE: Multidetector CT imaging of the chest was performed using the standard protocol during bolus administration of intravenous contrast. Multiplanar CT image reconstructions and MIPs were obtained to evaluate the vascular anatomy.  CONTRAST:  13mL OMNIPAQUE IOHEXOL 350 MG/ML SOLN  COMPARISON:  Chest radiograph May 19, 2014  FINDINGS: There is no demonstrable pulmonary embolus. There is no appreciable thoracic aortic aneurysm.  There are innumerable small pulmonary nodular lesions throughout the lungs consistent with metastatic foci. Nodular lesions range in size from as small as 2 mm to as  large as 1.8 x 1.7 cm on the right and in the left mid lung measuring 1.6 x 1.3 cm. There is consolidation throughout much of the left lower lobe.  There is an enlarged lymph node in the aortopulmonary window region measuring 2.0 x 1.6 cm. There is adenopathy to the left of the trachea measuring 2.8 by 1.5 cm. There is adenopathy anterior to the carina measuring 1.9 x 1.7 cm. There is sub- carinal adenopathy measuring 3.4 x 1.8 cm. There is probable adenopathy in the left hilum which is indistinguishable from the adjacent airspace consolidation. There may well be an endobronchial mass in the left lower lobe as well.  There is fluid in wall thickening throughout the esophagus from the level of the aortic arch to the gastroesophageal junction. Proximal to this area, there is distention esophagus with air.  Pericardium is not thickened. Patient is status post coronary artery bypass grafting.  In the visualized upper abdomen, mild ascites is appreciable. There is a nodular lesion in the posterior segment of the right lobe of the liver measuring 1.7 x 1.5 cm.  There are no blastic or lytic bone lesions. There is slight anterior wedging of 2 mid thoracic vertebral bodies. Thyroid appears unremarkable.  Review of the MIP images confirms the above findings.  IMPRESSION: No demonstrable pulmonary embolus.  Innumerable pulmonary nodular lesions throughout the lungs consistent with widespread metastatic disease. There also foci of adenopathy. There is left lower lobe consolidation. There may be an endobronchial lesion obstructing the left lower lobe bronchus.  Generalized esophageal dilatation with fluid extending from the level of the aortic arch to the gastroesophageal junction. This finding could indicate a distal esophageal mass. Direct visualization may be advisable given this appearance.  Small nodular lesion in the right lobe of liver, concerning for metastatic focus given other findings.   Electronically Signed   By:  Lowella Grip M.D.   On: 05/19/2014 21:42   Mr Jeri Cos RX Contrast  05/21/2014   CLINICAL DATA:  Suspected lung cancer with metastatic disease. Staging. Shortness of breath but no current neurologic symptoms.  EXAM: MRI HEAD WITHOUT AND WITH CONTRAST  TECHNIQUE: Multiplanar, multiecho pulse sequences of the brain and surrounding structures were obtained without and with intravenous contrast.  CONTRAST:  89mL MULTIHANCE GADOBENATE DIMEGLUMINE 529 MG/ML IV SOLN  COMPARISON:  None.  FINDINGS: No acute stroke or hemorrhage. No hydrocephalus or extra-axial fluid. Generalized atrophy. T2 and FLAIR hyperintensities throughout the periventricular greater than subcortical white matter, likely chronic microvascular ischemic change. Flow voids are maintained. No foci of chronic hemorrhage. Partial empty sella. No osseous destructive lesions.  Post infusion, there are numerous enhancing lesions throughout the brain consistent with metastatic disease. As seen on postcontrast axial T1 series 12, they include:  - RIGHT cerebellum, image 5, 3 mm.  -  RIGHT insula, image 10, 2 mm.  - LEFT occipital periventricular white matter, 2 mm, image 10.  - LEFT occipital cortex, 3 mm, image 10.  - RIGHT lentiform nucleus, 3 mm, image 11.  - LEFT medial occipital cortex, 2 mm, image 11.  - LEFT medial parietal subcortical white matter, 1 mm, image 14.  - more superior LEFT parietal subcortical white matter, 2 mm, image 15.  -RIGHT frontal subcortical white matter, 1 mm, image 16.  - more anterior RIGHT frontal subcortical white matter, 4 mm, image 16. New  - RIGHT posterior frontal subcortical white matter, 1 mm, image 18.  Given the miliary nature of these lesions, and their small size, it is possible that more lesions exists but new lesions listed above can be correlated in at least 1 other plane.  No definite sinus or mastoid disease.  IMPRESSION: At least Eleven suspected metastases, many of which are 3 mm or less. Given the chest  and abdomen findings, metastatic small cell lung cancer is suspected.  No significant mass effect or cerebral edema associated with these multiple lesions.  Generalized atrophy with mild small vessel disease.  These results will be called to the ordering clinician or representative by the Radiologist Assistant, and communication documented in the PACS or zVision Dashboard.   Electronically Signed   By: Rolla Flatten M.D.   On: 05/21/2014 20:56   Ct Abdomen Pelvis W Contrast  05/21/2014   CLINICAL DATA:  Abnormal CT a chest yesterday. Significant weight loss and metastatic disease.  EXAM: CT ABDOMEN AND PELVIS WITH CONTRAST  TECHNIQUE: Multidetector CT imaging of the abdomen and pelvis was performed using the standard protocol following bolus administration of intravenous contrast.  CONTRAST:  44mL OMNIPAQUE IOHEXOL 300 MG/ML  SOLN  COMPARISON:  CT chest 05/19/2014  FINDINGS: Lung bases demonstrate multiple pulmonary nodules throughout both lungs consistent with metastatic disease. There is consolidation in the left lung base which may represent postobstructive pneumonia from an occult central lesion or it could represent consolidated pneumonia. The esophagus is distended and filled with fluid. Superior air-fluid level. Distal esophageal wall is thickened. Esophageal mass is not excluded. This could also represent reflux disease. Enlarged lymph nodes in the mediastinum.  Multiple heterogeneous hypo enhancing liver lesions likely representing metastases. Mild diffuse upper abdominal ascites extending down along the paracolic gutters into the pelvis. The gallbladder, pancreas, spleen, adrenal glands, kidneys, and inferior vena cava appear normal. Calcification of the abdominal aorta without aneurysm. Scattered retroperitoneal lymph nodes are present measuring up to about 10 mm diameter. These are nonspecific but in the setting of widespread metastasis could represent early metastatic lesions. Stomach is not  abnormally distended. Small bowel are decompressed, limiting evaluation of the wall. Contrast material flows through the colon suggesting no evidence of small bowel obstruction. Contrast material and stool demonstrated throughout the colon. No evidence of colonic obstruction. No free air in the abdomen.  Pelvis: Diverticulosis of the sigmoid colon without evidence of diverticulitis. Free fluid in the pelvis as previously indicated. Prostate gland is not enlarged. Bladder wall is not thickened. Appendix is not identified. Degenerative changes in the spine and hips. There are few day low-attenuation lesions demonstrated in the right hemipelvis which are nonspecific could represent early bone metastases.  IMPRESSION: Diffuse metastatic disease in the visualized lungs and liver. Probable metastatic lymphadenopathy in the mediastinum. Mild enlargement of retroperitoneal lymph nodes possibly representing early metastasis. Diffuse free fluid in the abdomen. Consolidation in the left lower lung may represent pneumonia  or postobstructive change. Esophageal dilatation with distal esophageal wall thickening could represent neoplasm or reflux disease.   Electronically Signed   By: Lucienne Capers M.D.   On: 05/21/2014 20:12   Dg Chest Portable 1 View  05/19/2014   CLINICAL DATA:  Cough and shortness of breath for 3 weeks  EXAM: PORTABLE CHEST - 1 VIEW  COMPARISON:  None.  FINDINGS: There is underlying emphysema. There is airspace consolidation in the left lower lobe.  There is a 1.8 x 1.5 cm nodular lesion in the right mid lung.  Heart size is normal. Pulmonary vascularity reflects underlying emphysema. Patient is status post coronary artery bypass grafting. No adenopathy.  IMPRESSION: 1.8 x 1.5 cm nodular lesion right mid lung. Advise noncontrast enhanced chest CT to further evaluate.  Left lower lobe consolidation.  Underlying emphysema.   Electronically Signed   By: Lowella Grip M.D.   On: 05/19/2014 20:14   Dg  C-arm Bronchoscopy  05/22/2014   CLINICAL DATA:    C-ARM BRONCHOSCOPY  Fluoroscopy was utilized by the requesting physician.  No radiographic  interpretation.     Assessment: 64 y.o. 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 They understand stage IV NSCLC is not curable, though it is treatable. We reviewed the fact that chemotherapy agents generally do not cross the blood brain barrier and he will start with irradiation to the brain, then consider chemotherapy. They are very clear they do not want resuscitation in case of a terminal event and I wrote a DNR order.  Dr Tammi Klippel has already scheduled the start of brain XRT for 2/22. The patient has an appointment with our lung specialist, Dr Julien Nordmann, 2/23. I have discussed his situation with nursing so they are aware patient will need special treatment especially at night when there is no family present. I note GI is evaluating the patient to see if we can improve on the achalasia problem so he can take po's. I have requested PT and OT eval-- if patient is able to transfer to w/c and back, improved cognitively and can keep himself nourished and hydrated po, he may be able to be discharged home. Otherwise he may need placement.  Please let me know if I can be of further help at this time.   Chauncey Cruel, MD 05/31/2014  8:52 AM Medical Oncology and Hematology [facility] [address] [place], [place] [address] Tel. [phone]    Fax. [phone]

## 2014-06-01 ENCOUNTER — Inpatient Hospital Stay (HOSPITAL_COMMUNITY): Payer: Medicaid Other

## 2014-06-01 MED ORDER — VANCOMYCIN HCL IN DEXTROSE 1-5 GM/200ML-% IV SOLN
1000.0000 mg | Freq: Two times a day (BID) | INTRAVENOUS | Status: DC
  Administered 2014-06-01 – 2014-06-04 (×6): 1000 mg via INTRAVENOUS
  Filled 2014-06-01 (×7): qty 200

## 2014-06-01 MED ORDER — PIPERACILLIN-TAZOBACTAM 3.375 G IVPB
3.3750 g | Freq: Three times a day (TID) | INTRAVENOUS | Status: DC
  Administered 2014-06-01 – 2014-06-04 (×6): 3.375 g via INTRAVENOUS
  Filled 2014-06-01 (×9): qty 50

## 2014-06-01 NOTE — Clinical Social Work Psychosocial (Signed)
Clinical Social Work Department BRIEF PSYCHOSOCIAL ASSESSMENT 06/01/2014  Patient:  Erik Blake, Erik Blake     Account Number:  [id number]     Admit date:  05/30/2014  Clinical Social Worker:  Dede Query, CLINICAL SOCIAL WORKER  Date/Time:  06/01/2014 10:10 AM  Referred by:  Physician  Date Referred:  06/01/2014 Referred for  SNF Placement   Other Referral:   Interview type:  Family Other interview type:   wife at bedside    PSYCHOSOCIAL DATA Living Status:  WIFE Admitted from facility:   Level of care:   Primary support name:  Erik Blake Primary support relationship to patient:  SPOUSE Degree of support available:   high/ wife has taken some time off of work (only working part time) to help with pt needs    CURRENT CONCERNS  Other Concerns:    SOCIAL WORK ASSESSMENT / PLAN CSW met with pt and his wife at bedside.  CSW explained role of social work and provided information about services.  Pt was asleep so CSW prompted pt's wife to discuss history and needs.  CSW explored wife's feelings associated with pt's diagnoses. CSW provided active listening and emotional support.    CSW provided a list of SNF/rehab facilities that pt may transfer to at discharge.   Assessment/plan status:  Psychosocial Support/Ongoing Assessment of Needs Other assessment/ plan:   Fax information to SNF facilities.  Follow up with pt's wife on Monday to obtain medicaid number   Information/referral to community resources:    PATIENT'S/FAMILY'S RESPONSE TO PLAN OF CARE: Pt's wife discussed pt having difficulty with his swallowing and medical staff are working on this before his radiatio treatment starts on Monday.  Pt's wife stated that she and pt are separated but she brought him to her house when he got sick a month ago.  Pt's wife stated that pt is on disability and was told he had to wait to get his medicare insurance for two years which would have been October of this year.  Pt's wife  has worked with [facility]  to obtain Medicaid for pt.  Pt's wife stated that pt's medicaid is retroactive to pay for previous hospital bills and she will have his medicaid number on Monday.  Pt's wife would like for pt to go to SNF to get his strength back so that he can come back home.  Pt has two daughters, one lives in town and the other in [place].  Pt will start radiation on Monday and will have 14 treatments.  Pt's wife stated "its been a lot" and she thought with pt's brain swelling "he understood but doesn't understand."  Pt's wife is supporting pt and she is being supported by her brothers who live local.     .Dede Query, [place] Worker - Weekend Coverage cell #: [phone]

## 2014-06-01 NOTE — Progress Notes (Signed)
Subjective:  Patient complains of dysphagia not able to tolerate even clear liquids. Also complains of coughing up whitish phlegm no fever or chills. No chest pain.  Objective:  Vital Signs in the last 24 hours: Temp:  [97.5 F (36.4 C)-97.6 F (36.4 C)] 97.5 F (36.4 C) (02/21 0549) Pulse Rate:  [79-104] 79 (02/21 0549) Resp:  [20-25] 25 (02/21 0549) BP: (100-130)/(70-90) 114/70 mmHg (02/21 0549) SpO2:  [91 %-93 %] 93 % (02/21 0549)  Intake/Output from previous day: 02/20 0701 - 02/21 0700 In: 1380 [P.O.:480; I.V.:900] Out: 500 [Urine:500] Intake/Output from this shift:    Physical Exam: Neck: no adenopathy, no carotid bruit, no JVD and supple, symmetrical, trachea midline Lungs: Occasional rhonchi noted Heart: irregularly irregular rhythm, S1, S2 normal and Soft systolic murmur noted Abdomen: soft, non-tender; bowel sounds normal; no masses,  no organomegaly Extremities: extremities normal, atraumatic, no cyanosis or edema  Lab Results:  Recent Labs  05/30/14 0818 05/31/14 0500  WBC 18.3* 19.6*  HGB 16.3 16.1  PLT 103* 101*    Recent Labs  05/30/14 0818 05/31/14 0500  NA 132* 131*  K 4.3 4.6  CL 99 98  CO2 24 21  GLUCOSE 91 120*  BUN 47* 53*  CREATININE 1.11 1.13    Recent Labs  05/30/14 0818  TROPONINI 0.03   Hepatic Function Panel  Recent Labs  05/31/14 0500  PROT 6.0  ALBUMIN 2.3*  AST 243*  ALT 102*  ALKPHOS 519*  BILITOT 10.6*   No results for input(s): CHOL in the last 72 hours. No results for input(s): PROTIME in the last 72 hours.  Imaging: Imaging results have been reviewed and No results found.  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 fib with moderate ventricular response Elevated LFTs secondary to 1 Failure to thrive/protein calorie malnutrition CAD history of MI 3 status post CABG in the past status post PCI to protected left main in the  past Hypertension Prediabetic Achalasia of distal esophagus History of tobacco abuse approximately 50-pack-years Depression Marked leukocytosis probably secondary to steroids rule out pneumonia Plan Check chest x-ray Will reconsult GI regarding further management for his dysphagia/achalasia  LOS: 2 days    Mckinlee Dunk N 06/01/2014, 9:11 AM

## 2014-06-01 NOTE — Progress Notes (Signed)
ANTIBIOTIC CONSULT NOTE - INITIAL  Pharmacy Consult for Vancomycin / Zosyn Indication: HCAP  Allergies  Allergen Reactions  . Isosorbide Swelling    Swelling of left side of face    Patient Measurements: Height: 5\' 8"  (172.7 cm) Weight: 172 lb 13.5 oz (78.4 kg) IBW/kg (Calculated) : 68.4 Adjusted Body Weight:   Vital Signs: Temp: 97.5 F (36.4 C) (02/21 1338) Temp Source: Axillary (02/21 1338) BP: 113/85 mmHg (02/21 1338) Pulse Rate: 95 (02/21 1338) Intake/Output from previous day: 02/20 0701 - 02/21 0700 In: 1380 [P.O.:480; I.V.:900] Out: 500 [Urine:500] Intake/Output from this shift: Total I/O In: 1560 [P.O.:60; I.V.:1500] Out: 225 [Urine:225]  Labs:  Recent Labs  05/30/14 0818 05/31/14 0500  WBC 18.3* 19.6*  HGB 16.3 16.1  PLT 103* 101*  CREATININE 1.11 1.13   Estimated Creatinine Clearance: 65.6 mL/min (by C-G formula based on Cr of 1.13). No results for input(s): VANCOTROUGH, VANCOPEAK, VANCORANDOM, GENTTROUGH, GENTPEAK, GENTRANDOM, TOBRATROUGH, TOBRAPEAK, TOBRARND, AMIKACINPEAK, AMIKACINTROU, AMIKACIN in the last 72 hours.   Microbiology: Recent Results (from the past 720 hour(s))  Blood culture (routine x 2)     Status: None   Collection Time: 05/19/14  8:45 PM  Result Value Ref Range Status   Specimen Description BLOOD FOREARM RIGHT  Final   Special Requests BOTTLES DRAWN AEROBIC AND ANAEROBIC 5CC EA  Final   Culture   Final    NO GROWTH 5 DAYS Performed at [facility]    Report Status 05/26/2014 FINAL  Final  Culture, sputum-assessment     Status: None   Collection Time: 05/20/14 12:33 AM  Result Value Ref Range Status   Specimen Description SPUTUM  Final   Special Requests NONE  Final   Sputum evaluation   Final    MICROSCOPIC FINDINGS SUGGEST THAT THIS SPECIMEN IS NOT REPRESENTATIVE OF LOWER RESPIRATORY SECRETIONS. PLEASE RECOLLECT. CALLED TO I.OSAGIE,RN 0150 05/20/14 M.CAMPBELL    Report Status 05/20/2014 FINAL  Final  Culture,  blood (routine x 2) Call MD if unable to obtain prior to antibiotics being given     Status: None   Collection Time: 05/20/14 12:47 AM  Result Value Ref Range Status   Specimen Description BLOOD RIGHT HAND  Final   Special Requests BOTTLES DRAWN AEROBIC AND ANAEROBIC 5CC EA  Final   Culture   Final    NO GROWTH 5 DAYS Performed at [facility]    Report Status 05/26/2014 FINAL  Final  Urine culture     Status: None   Collection Time: 05/30/14  9:01 AM  Result Value Ref Range Status   Specimen Description URINE, CLEAN CATCH  Final   Special Requests NONE  Final   Colony Count NO GROWTH Performed at [facility]   Final   Culture NO GROWTH Performed at [facility]   Final   Report Status 05/31/2014 FINAL  Final    Medical History: Past Medical History  Diagnosis Date  . Hypertension 2012  . Myocardial infarction 2003  . Hyperlipidemia 2012  . Achalasia of esophagus   . Tobacco abuse   . CAD (coronary artery disease)   . Weight loss 05/2014    Approx 50 lbs  . GERD (gastroesophageal reflux disease)   . Metastatic cancer    Assessment: 46 yoM presented to ED with weakness, anorexia 2/2 achalasia, and N/V, found to be in Afib with RVR.  PMHx significant for new diagnosis of metastatic adenocarcinoma of lung, CAD s/p MI x 2 and CABG, AFib,  and achalasia.  Note pt hospitalized recently but left AMA. CXR today shows bilateral LL infiltrates and pharmacy consulted to start Vancomycin and Zosyn for HCAP.   2/21 >> Vanc  >> 2/21 >> Zosyn  >>    Tmax: Afebrile WBCs: elevated 19.6 Renal: SCr 1.13, CrCl ~66 ml/min (N68)  2/19 urine: NGF   Goal of Therapy:  Vancomycin trough level 15-20 mcg/ml Eradication of infection  Plan:  Zosyn 3.375g IV q8h (infuse over 4 hours) Vancomycin 1g IV q12h F/u renal fxn, clinical course, vanc trough as warranted  Ralene Bathe, PharmD, BCPS 06/01/2014, 5:45 PM  Pager: [phone]

## 2014-06-01 NOTE — Progress Notes (Signed)
Endo schedule full tomorrow morning and patient is due to start radiation tomorrow afternoon and according to wife will have radiation Tuesday morning. We will therefore schedule EGD with Botox injection Tuesday afternoon at 1 o'clock

## 2014-06-01 NOTE — Clinical Social Work Placement (Signed)
Clinical Social Work Department CLINICAL SOCIAL WORK PLACEMENT NOTE 06/01/2014  Patient:  Erik Blake, Erik Blake  Account Number:  [id number] [place] date:  05/30/2014  Clinical Social Worker:  Dede Query, CLINICAL SOCIAL WORKER  Date/time:  06/01/2014 10:28 AM  Clinical Social Work is seeking post-discharge placement for this patient at the following level of care:   [place]   (*CSW will update this form in Epic as items are completed)   06/01/2014  Patient/family provided with [place] Department of Clinical Social Work's list of facilities offering this level of care within the geographic area requested by the patient (or if unable, by the patient's family).  06/01/2014  Patient/family informed of their freedom to choose among providers that offer the needed level of care, that participate in Medicare, Medicaid or managed care program needed by the patient, have an available bed and are willing to accept the patient.  06/01/2014  Patient/family informed of [facility]' ownership interest in [facility], as well as of the fact that they are under no obligation to receive care at this facility.  PASARR submitted to EDS on 06/01/2014 PASARR number received on 06/01/2014  FL2 transmitted to all facilities in geographic area requested by pt/family on  06/01/2014 FL2 transmitted to all facilities within larger geographic area on 06/01/2014  Patient informed that his/her managed care company has contracts with or will negotiate with  certain facilities, including the following:     Patient/family informed of bed offers received:   Patient chooses bed at  Physician recommends and patient chooses bed at    Patient to be transferred to  on   Patient to be transferred to facility by  Patient and family notified of transfer on  Name of family member notified:    The following physician request were entered in Epic:   Additional Comments:  .Dede Query, [place] Social Worker - Weekend Coverage cell #: [phone]

## 2014-06-01 NOTE — Progress Notes (Signed)
[facility] PROGRESS NOTE Subjective Pt still regurgitating everything. Treatment plan by Dr Griffith Citron includes chemo beginning tomorrow. Pt had negative EGD recently and barium esophagram showing classic achalasia.  Objective: Vital signs in last 24 hours: Temp:  [97.5 F (36.4 C)-97.6 F (36.4 C)] 97.5 F (36.4 C) (02/21 0549) Pulse Rate:  [79-104] 89 (02/21 1104) Resp:  [20-25] 25 (02/21 0549) BP: (100-117)/(70-82) 117/71 mmHg (02/21 1104) SpO2:  [91 %-93 %] 93 % (02/21 0549) Last BM Date: 05/28/14  Intake/Output from previous day: 02/20 0701 - 02/21 0700 In: 1380 [P.O.:480; I.V.:900] Out: 500 [Urine:500] Intake/Output this shift:    PE General--regurgitating clear material   Lab Results:  Recent Labs  05/30/14 0818 05/31/14 0500  WBC 18.3* 19.6*  HGB 16.3 16.1  HCT 46.9 46.2  PLT 103* 101*   BMET  Recent Labs  05/30/14 0818 05/31/14 0500  NA 132* 131*  K 4.3 4.6  CL 99 98  CO2 24 21  CREATININE 1.11 1.13   LFT  Recent Labs  05/30/14 0818 05/31/14 0500  PROT 6.2 6.0  AST 278* 243*  ALT 105* 102*  ALKPHOS 543* 519*  BILITOT 9.5* 10.6*   PT/INR No results for input(s): LABPROT, INR in the last 72 hours. PANCREAS  Recent Labs  05/30/14 0818  LIPASE 31         Studies/Results: Dg Chest 2 View  06/01/2014   CLINICAL DATA:  Cough, hypertension, coronary artery disease post MI, smoker, metastatic lung cancer  EXAM: CHEST  2 VIEW  COMPARISON:  05/19/2014 chest radiographs and CT chest  FINDINGS: Normal heart size post median sternotomy and CABG.  Mediastinal contours normal.  Significant LEFT lower lobe infiltrate, increased.  Mildly increased infiltrate in RIGHT lower lobe as well.  Nodular foci in both lungs compatible metastases.  Small LEFT pleural effusion.  No pneumothorax.  Bones appear demineralized.  Small lytic lesion at the proximal LEFT humerus.  IMPRESSION: Pulmonary metastases.  Increased BILATERAL lower lobe  infiltrates greater on LEFT.   Electronically Signed   By: Lavonia Dana M.D.   On: 06/01/2014 11:30    Medications: I have reviewed the patient's current medications.  Assessment/Plan: 1. N+V/ahalasia. Given the classic esophagram, don't feel manometry indicated in this man with terminal cancer. Presented options of full work up ie manometry lap Heller myotomy, etc or PEG tube or EGD with Botox injection with PEG tube if doesn't respond.  Will proceed with EGD and Botox injection tomorrow or Tuesday depending on endo schedule and chemo schedule   Tarren Sabree JR,Joash Tony L 06/01/2014, 11:39 AM

## 2014-06-01 NOTE — Progress Notes (Signed)
Went to do assessment on patient, his left foot was ice cold and hard to feel a pulse. I got the doppler and check a pulse. Marked two pulse sites. Patient could move his foot and push against my hands. Will continue to monitor patient. Wrap foot up in a blanket. Patient stated that his foot stays like that all the time.Philemon Kingdom D

## 2014-06-01 NOTE — Progress Notes (Signed)
NUTRITION FOLLOW-UP/CONSULT       INTERVENTION: -Recommend SLP consult -Continue Resource Breeze BID when diet advanced providing 250 kcal and 9 g protein -If pt continues to not tolerate PO's recommend initiation of nutrition support. RD will provide recommendations if needed. -RD to monitor for GOC  NUTRITION DIAGNOSIS: Inadequate oral intake related to esophageal achalasia as evidenced by difficulty swallowing, continues  Goal: Pt to meet >/= 90% of estimated needs, unmet  Monitor:  PO and supplemental intake, GOC, weight trends, labs  ASSESSMENT: Pt with numerous brain metastases and esophageal achalasia. Past medical history of CAD, MI x3 post CABG, HTN, impaired glucose tolerance, tobacco abuse.   RD consulted for nutritional assessment, pt assessed initially on 2/19.  Pt continues to not tolerate clear liquids. PO intake: 0%. Per MD note, GI to be re-consulted.  Per GI note on 2/19, if pt pursues radiation and chemotherapy, PEG placement is recommended. If nutrition support is considered, RD to provide recommendations. Per H&P, palliative care may be consulted d/t long term poor prognosis. RD to monitor for GOC.  Height: Ht Readings from Last 1 Encounters:  05/30/14 5\' 8"  (1.727 m)    Weight: Wt Readings from Last 1 Encounters:  05/30/14 172 lb 13.5 oz (78.4 kg)    BMI:  Body mass index is 26.29 kg/(m^2).  Estimated Nutritional Needs: Kcal: 2100-2300 kcals Protein: 90-105 g protein  Fluid:  >/= 2100 ml/day  Skin: Jaundice, dry, ecchymosis  Diet Order: DIET - DYS 1  EDUCATION NEEDS: -No education needs identified at this time   Intake/Output Summary (Last 24 hours) at 06/01/14 0936 Last data filed at 05/31/14 2200  Gross per 24 hour  Intake   1140 ml  Output    500 ml  Net    640 ml    Last BM: 2/19   Labs:   Recent Labs Lab 05/30/14 0818 05/31/14 0500  NA 132* 131*  K 4.3 4.6  CL 99 98  CO2 24 21  BUN 47* 53*  CREATININE 1.11 1.13   CALCIUM 9.2 9.1  GLUCOSE 91 120*    CBG (last 3)   Recent Labs  05/31/14 1209  GLUCAP 159*    Scheduled Meds: . ALPRAZolam  0.5 mg Oral BID  . amiodarone  200 mg Oral Daily  . apixaban  5 mg Oral BID  . aspirin EC  81 mg Oral Daily  . dexamethasone  4 mg Intravenous 4 times per day  . digoxin  0.125 mg Intravenous Daily  . feeding supplement (RESOURCE BREEZE)  1 Container Oral BID BM  . pantoprazole (PROTONIX) IV  40 mg Intravenous QHS  . PARoxetine  20 mg Oral Daily    Continuous Infusions: . sodium chloride 75 mL/hr at 06/01/14 0409   Clayton Bibles, MS, RD, LDN Pager: [phone] After Hours Pager: [phone]

## 2014-06-02 ENCOUNTER — Telehealth: Payer: Self-pay | Admitting: Radiation Oncology

## 2014-06-02 ENCOUNTER — Ambulatory Visit: Payer: Medicaid Other | Admitting: Radiation Oncology

## 2014-06-02 NOTE — Evaluation (Signed)
Occupational Therapy Evaluation Patient Details Name: Erik Blake MRN: [id number] DOB: 1950/04/29 Today's Date: 06/02/2014    History of Present Illness Patient is 64 year old male with past medical history significant for metastatic adenocarcinoma of the lung status post bronchoscopy/biopsy with metastases to brain and liver, coronary artery disease history of MI 3 in the past status post CABG, hypertension, chronic atrial fibrillation, achalasia distal esophagus, and history of tobacco abuse 50 pack years, severe protein calorie malnutrition, depression, came to the ER complaining of generalized weakness and inability to get out of bed and poor appetite associated with nausea and vomiting for 1 week.    Clinical Impression   Pt admitted with weakness. Pt currently with functional limitations due to the deficits listed below (see OT Problem List). Pt will benefit from skilled OT to increase their safety and independence with ADL and functional mobility for ADL to facilitate discharge to venue listed below.      Follow Up Recommendations  SNF    Equipment Recommendations  None recommended by OT    Recommendations for Other Services       Precautions / Restrictions Precautions Precautions: Fall      Mobility Bed Mobility Overal bed mobility: Needs Assistance Bed Mobility: Rolling Rolling: Max assist   Supine to sit: Max assist     General bed mobility comments: pt became agitated and refused to further work with OT  Transfers                 General transfer comment: did not perform         ADL Overall ADL's : Needs assistance/impaired     Grooming: Maximal assistance;Bed level                                 General ADL Comments: refused further ADL eval.  Pt states he is done with all this and wants to go home and rest. Shared with RN     Vision     Perception     Praxis      Pertinent Vitals/Pain Pain Assessment:  Faces Faces Pain Scale: Hurts even more Pain Location: my back/bottom Pain Descriptors / Indicators: Sore Pain Intervention(s): Monitored during session;Repositioned     Hand Dominance     Extremity/Trunk Assessment Upper Extremity Assessment Upper Extremity Assessment: Generalized weakness           Communication Communication Communication: No difficulties;HOH   Cognition Arousal/Alertness: Lethargic Behavior During Therapy: Agitated Overall Cognitive Status: Impaired/Different from baseline Area of Impairment: Safety/judgement;Awareness;Memory;Problem solving         Safety/Judgement: Decreased awareness of safety;Decreased awareness of deficits   Problem Solving: Requires verbal cues;Requires tactile cues     General Comments               Home Living Family/patient expects to be discharged to:: Private residence Living Arrangements: Spouse/significant other Available Help at Discharge: Family;Available PRN/intermittently (wife works full time) Type of Home: House Home Access: Stairs to enter [employer] of Steps: 2   Home Layout: One level               Home Equipment: Toilet riser;Walker - 4 wheels;Shower seat          Prior Functioning/Environment          Comments: wasn't able to walk for 2 days prior to admission due to BLE weakness, walked with rollator prior to that, wife stated  pt has had 3 months of LE weakness    OT Diagnosis: Generalized weakness;Cognitive deficits   OT Problem List: Decreased strength;Decreased activity tolerance;Decreased cognition;Decreased safety awareness   OT Treatment/Interventions: Self-care/ADL training;DME and/or AE instruction;Patient/family education    OT Goals(Current goals can be found in the care plan section)    OT Frequency: Min 2X/week   Barriers to D/C:               End of Session Nurse Communication: Mobility status  Activity Tolerance: Treatment limited secondary to  agitation Patient left: in bed;with call bell/phone within reach   Time: 6256-3893 OT Time Calculation (min): 31 min Charges:  OT General Charges $OT Visit: 1 Procedure OT Evaluation $Initial OT Evaluation Tier I: 1 Procedure OT Treatments $Self Care/Home Management : 8-22 mins G-Codes:    Payton Mccallum D 06-Jun-2014, 10:22 AM

## 2014-06-02 NOTE — Progress Notes (Signed)
Received call from inpatient RN, Daine Floras, that the patient is refusing radiation treatment today. Visited with patient in his hospital room with the assumption he was confused due to effects of brain mets and needed clarification about importance of treatment. Patient alert and oriented to person, place, and time. Patient very angry. Patient states, "I feel like no one is listening to me." Explained he is scheduled for radiation treatment this afternoon at 3:15. Explained purpose of these treatments. Patient states, "I keep telling these people I don't want no treatments I want to go home." Explained symptoms could become worse if he doesn't receive treatment. Patient states, "I don't care if they get worse..maybe they will then, maybe I can just die." Patient stated several times during our conversation his desire to "go home, be left alone, and die." Skin jaundice. Ultimately, patient refused radiation. Called in consult to Wadie Lessen, NP for palliative care. Wadie Lessen, NP will meet with patient, his wife and daughter tomorrow morning at 0800. Wadie Lessen, NP will inform this RN if the patient plans to pursue radiation any further. Patient aware of this meeting. Phoned daughter and wife making them aware patient refuses radiation treatment today. Both understand that radiation is available in the future should the patient change his mind. Informed RTs of L2 of this finding.Patient remains on radiation schedule for tomorrow in the event he changes his mind after palliative care meeting.

## 2014-06-02 NOTE — Progress Notes (Signed)
PT Cancellation Note  Patient Details Name: Erik Blake MRN: [id number] DOB: 27-Apr-1950   Cancelled Treatment:    Reason Eval/Treat Not Completed: pt became agitated/upset when therapist explained purpose for visit. Pt refused to participate stating "Im done with all this. I just wanna go home."    Weston Anna, MPT Pager: [phone]

## 2014-06-02 NOTE — Progress Notes (Addendum)
Subjective:  Continues to have dysphagia even to clear liquids. Denies any chest pains. Chest x-ray showed bilateral infiltrates started on Zosyn and vancomycin yesterday  Objective:  Vital Signs in the last 24 hours: Temp:  [97.4 F (36.3 C)-97.5 F (36.4 C)] 97.5 F (36.4 C) (02/22 0530) Pulse Rate:  [89-120] 110 (02/22 1345) Resp:  [19-25] 24 (02/22 1345) BP: (111-120)/(74-93) 112/74 mmHg (02/22 1345) SpO2:  [91 %-95 %] 95 % (02/22 1345)  Intake/Output from previous day: 02/21 0701 - 02/22 0700 In: 2015 [P.O.:60; I.V.:1755; IV Piggyback:200] Out: 225 [Urine:225] Intake/Output from this shift: Total I/O In: 1595 [I.V.:1545; IV Piggyback:50] Out: -   Physical Exam: Neck: no adenopathy, no carotid bruit, no JVD and supple, symmetrical, trachea midline Lungs: Bilateral ronchi noted. Heart: irregularly irregular rhythm and S1, S2 normal Abdomen: soft, non-tender; bowel sounds normal; no masses,  no organomegaly Extremities: extremities normal, atraumatic, no cyanosis or edema  Lab Results:  Recent Labs  05/31/14 0500  WBC 19.6*  HGB 16.1  PLT 101*    Recent Labs  05/31/14 0500  NA 131*  K 4.6  CL 98  CO2 21  GLUCOSE 120*  BUN 53*  CREATININE 1.13   No results for input(s): TROPONINI in the last 72 hours.  Invalid input(s): CK, MB Hepatic Function Panel  Recent Labs  05/31/14 0500  PROT 6.0  ALBUMIN 2.3*  AST 243*  ALT 102*  ALKPHOS 519*  BILITOT 10.6*   No results for input(s): CHOL in the last 72 hours. No results for input(s): PROTIME in the last 72 hours.  Imaging: Imaging results have been reviewed and Dg Chest 2 View  06/01/2014   CLINICAL DATA:  Cough, hypertension, coronary artery disease post MI, smoker, metastatic lung cancer  EXAM: CHEST  2 VIEW  COMPARISON:  05/19/2014 chest radiographs and CT chest  FINDINGS: Normal heart size post median sternotomy and CABG.  Mediastinal contours normal.  Significant LEFT lower lobe infiltrate,  increased.  Mildly increased infiltrate in RIGHT lower lobe as well.  Nodular foci in both lungs compatible metastases.  Small LEFT pleural effusion.  No pneumothorax.  Bones appear demineralized.  Small lytic lesion at the proximal LEFT humerus.  IMPRESSION: Pulmonary metastases.  Increased BILATERAL lower lobe infiltrates greater on LEFT.   Electronically Signed   By: Lavonia Dana M.D.   On: 06/01/2014 11:30    Cardiac Studies:  Assessment/Plan:  Bilateral aspiration pneumonia. 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 fib with moderate ventricular response Elevated LFTs secondary to 1 Failure to thrive/protein calorie malnutrition CAD history of MI 3 status post CABG in the past status post PCI to protected left main in the past Hypertension Prediabetic Dysphagia Achalasia of distal esophagus History of tobacco abuse approximately 50-pack-years Depression Marked leukocytosis  Plan Continue present management Schedule for Botox injection tomorrow Palliative care meeting with family tomorrow  LOS: 3 days    Erik Blake N 06/02/2014, 5:45 PM

## 2014-06-02 NOTE — Progress Notes (Signed)
Notified Dr. Terrence Dupont regarding patients heartrate. He has been having spurts of AFib with RVR in the 160's. PRN meds given. Palliative Care consult has again also been requested. Patient states he does not want radiation and is ready to go home.

## 2014-06-02 NOTE — Telephone Encounter (Signed)
Phoned patient's wife, Cecille Rubin. No answer. Left message requesting return call. Phoned patient's daughter, Joelene Millin. No answer. Left message requesting return call.

## 2014-06-03 ENCOUNTER — Ambulatory Visit: Payer: Self-pay | Admitting: Internal Medicine

## 2014-06-03 ENCOUNTER — Ambulatory Visit: Payer: Medicaid Other

## 2014-06-03 ENCOUNTER — Encounter (HOSPITAL_COMMUNITY)
Admission: EM | Disposition: A | Discharge status: Hospice | Payer: Self-pay | Source: Home / Self Care | Attending: Cardiology

## 2014-06-03 ENCOUNTER — Other Ambulatory Visit: Payer: Self-pay

## 2014-06-03 ENCOUNTER — Ambulatory Visit: Payer: Medicaid Other | Admitting: Radiation Oncology

## 2014-06-03 DIAGNOSIS — Z66 Do not resuscitate: Secondary | ICD-10-CM

## 2014-06-03 DIAGNOSIS — D6959 Other secondary thrombocytopenia: Secondary | ICD-10-CM

## 2014-06-03 DIAGNOSIS — Z515 Encounter for palliative care: Secondary | ICD-10-CM

## 2014-06-03 DIAGNOSIS — C3432 Malignant neoplasm of lower lobe, left bronchus or lung: Secondary | ICD-10-CM

## 2014-06-03 DIAGNOSIS — R1314 Dysphagia, pharyngoesophageal phase: Secondary | ICD-10-CM

## 2014-06-03 DIAGNOSIS — D72828 Other elevated white blood cell count: Secondary | ICD-10-CM

## 2014-06-03 DIAGNOSIS — G893 Neoplasm related pain (acute) (chronic): Secondary | ICD-10-CM

## 2014-06-03 LAB — CBC
HEMATOCRIT: 47 % (ref 39.0–52.0)
Hemoglobin: 16.3 g/dL (ref 13.0–17.0)
MCH: 30.7 pg (ref 26.0–34.0)
MCHC: 34.7 g/dL (ref 30.0–36.0)
MCV: 88.5 fL (ref 78.0–100.0)
Platelets: 78 10*3/uL — ABNORMAL LOW (ref 150–400)
RBC: 5.31 MIL/uL (ref 4.22–5.81)
RDW: 21.5 % — AB (ref 11.5–15.5)
WBC: 23.7 10*3/uL — AB (ref 4.0–10.5)

## 2014-06-03 LAB — BASIC METABOLIC PANEL
Anion gap: 14 (ref 5–15)
BUN: 66 mg/dL — ABNORMAL HIGH (ref 6–23)
CO2: 19 mmol/L (ref 19–32)
CREATININE: 1.07 mg/dL (ref 0.50–1.35)
Calcium: 9.2 mg/dL (ref 8.4–10.5)
Chloride: 101 mmol/L (ref 96–112)
GFR calc Af Amer: 84 mL/min — ABNORMAL LOW (ref >90)
GFR calc non Af Amer: 72 mL/min — ABNORMAL LOW (ref >90)
Glucose, Bld: 94 mg/dL (ref 70–99)
Potassium: 4.9 mmol/L (ref 3.5–5.1)
Sodium: 134 mmol/L — ABNORMAL LOW (ref 135–145)

## 2014-06-03 SURGERY — EGD (ESOPHAGOGASTRODUODENOSCOPY)
Anesthesia: Monitor Anesthesia Care

## 2014-06-03 MED ORDER — SCOPOLAMINE 1 MG/3DAYS TD PT72
1.0000 | MEDICATED_PATCH | TRANSDERMAL | Status: DC
  Administered 2014-06-03: 1.5 mg via TRANSDERMAL
  Filled 2014-06-03: qty 1

## 2014-06-03 MED ORDER — ALPRAZOLAM 0.5 MG PO TABS
0.5000 mg | ORAL_TABLET | Freq: Four times a day (QID) | ORAL | Status: DC | PRN
  Administered 2014-06-04: 0.5 mg via ORAL
  Filled 2014-06-03: qty 1

## 2014-06-03 MED ORDER — BISACODYL 10 MG RE SUPP: 10.0000 mg | Freq: Every day | RECTAL | Status: DC | PRN

## 2014-06-03 MED ORDER — MORPHINE SULFATE (CONCENTRATE) 10 MG/0.5ML PO SOLN
5.0000 mg | ORAL | Status: DC | PRN
  Administered 2014-06-03 – 2014-06-04 (×3): 5 mg via ORAL
  Filled 2014-06-03 (×3): qty 0.5

## 2014-06-03 NOTE — Consult Note (Signed)
Patient Erik Blake      DOB: 1950-11-04      GUR:[id number]     Consult Note from the Palliative Medicine Team at [facility] Requested by: Dr Terrence Dupont   This NP contacted Dr Terrence Dupont after being contacted by nursing staff that patient and family in great need for Palliative Medicine consult.  Discussed with Dr Terrence Dupont and he agrees to consult.                                                                                                 PCP: Minerva Ends, MD Reason for Consultation: Clarification of [place] and options    Phone [phone]  Assessment of patients Current state:  Rapid physical, functional and cognitive decline 2/2 to newly diagnosed metastatic lung cancer (s/p 05-22-14 bronchoscopy/ invasive adenocarcinoma)  to liver and likely bone. Admitted with generalized weakness, protein calorie malnutrition, dysphagia due to achalasia. Patient and family  are faced with advanced directive decisions and anticipatory care needs  Consult is for review of medical treatment options, clarification of goals of care and end of life issues, disposition and options, and symptom recommendation.  This NP Wadie Lessen reviewed medical records, received report from team, assessed the patient and then meet at the patient's bedside along with his wife Prudencio Velazco and daughter Joelene Millin Mesiemore/husband Jonni Sanger to discuss diagnosis prognosis, [place], EOL wishes disposition and options.  A detailed discussion was had today regarding advanced directives.  Concepts specific to code status, artifical feeding and hydration, continued IV antibiotics and rehospitalization was had.  The difference between a aggressive medical intervention path  and a palliative comfort care path for this patient at this time was had.  Values and goals of care important to patient and family were attempted to be elicited.  Concept of Hospice and Palliative Care were discussed  Natural trajectory and  expectations at EOL were discussed.  Discussed in detail the natural dehydration process at EOL, regardless of diagnosis.  Questions and concerns addressed.  Hard Choices booklet left for review. Family encouraged to call with questions or concerns.  PMT will continue to support holistically.   Goals of Care: Focus is comfort quality and dignity.  No further diagnostics or treatments/medical interventions.  No radiation or botox. "I want to go home and die in peace"   Family is supportive of patient's wishes, he has been verbalizing since diagnosis    1.  Code Status:  DNR/DNI   2. Scope of Treatment: 1. Vital Signs: per unit  2. Respiratory/Oxygen: as needed for comfort 3. Nutritional Support/Tube Feeds: no artifical feeding now or in the future                -comfort sips and chips as tolerated                -educated on swish and spit 4. Antibiotics: continue through dc then no further antibiotic use 5. Blood Products: none 6. IVF: continue at current rate until dc, then no further artifical hydration 7. Review of Medications to be discontinued: minimize for comfort  8. Labs: none 9. Telemetry:none    3. Disposition: Home with hospice, will write for choice   4. Symptom Management:   1. Anxiety/Agitation: Xanax 0.5 mg po/sl every 6 hrs prn 2. Pain/Dyspnea: Roxanol 5 mg po/sl every 3 hrs prn 3. Bowel Regimen: Dulcolax supp prn 4. Increased oropharyngeal secretions-Scopolomine patch  5. Psychosocial:  Emotional support offered to patient and family at bedside.  Family verbalize great frustration with the "lack of communication", "we feel kept in the dark" Allowed time and space for family to tell their story and support them at this time of fear, sadness and grief.    Patient Documents Completed or Given: Document Given Completed  Advanced Directives Pkt    MOST X   DNR    Gone from My Sight    Hard Choices      Brief HPI:   63 yo male with continued physical and  functional decline /2 metastatic lung cancer to liver and brain as well as mediastinal and retroperitoneal lymphadenopathy. PMH CAD with h/o MI, HTN,a-fib, protein calorie malnutrition/albumin 2.3, achalasia Admitted with weakness, dysphagia, rapid physical and functional decline;  Elevated liver enzymes, decreased platelets, jaundice.  His main focus of care is comfort and to go home   ROS: back and right shoulder pain, difficulty swallowing, increased   PMH:  Past Medical History  Diagnosis Date  . Hypertension 2012  . Myocardial infarction 2003  . Hyperlipidemia 2012  . Achalasia of esophagus   . Tobacco abuse   . CAD (coronary artery disease)   . Weight loss 05/2014    Approx 50 lbs  . GERD (gastroesophageal reflux disease)   . Metastatic cancer      PSH: Past Surgical History  Procedure Laterality Date  . Coronary artery bypass graft  2003, 2015   . Left heart catheterization with coronary angiogram N/A 02/28/2012    Procedure: LEFT HEART CATHETERIZATION WITH CORONARY ANGIOGRAM;  Surgeon: Clent Demark, MD;  Location: [facility] C[facility] LAB;  Service: Cardiovascular;  Laterality: N/A;  . Percutaneous coronary stent intervention (pci-s) N/A 06/05/2012    Procedure: PERCUTANEOUS CORONARY STENT INTERVENTION (PCI-S);  Surgeon: Clent Demark, MD;  Location: [facility] CATH LAB;  Service: Cardiovascular;  Laterality: N/A;  . Esophagogastroduodenoscopy N/A 05/21/2014    Procedure: ESOPHAGOGASTRODUODENOSCOPY (EGD);  Surgeon: Wonda Horner, MD;  Location: [facility] ENDOSCOPY;  Service: Endoscopy;  Laterality: N/A;  . Video bronchoscopy N/A 05/22/2014    Procedure: VIDEO BRONCHOSCOPY WITH FLUORO;  Surgeon: Wilhelmina Mcardle, MD;  Location[facility] ENDOSCOPY;  Service: Cardiopulmonary;  Laterality: N/A;   I have reviewed the [place] and SH and  If appropriate update it with new information. Allergies  Allergen Reactions  . Isosorbide Swelling    Swelling of left side of face   Scheduled Meds: . ALPRAZolam  0.5 mg  Oral BID  . amiodarone  200 mg Oral Daily  . apixaban  5 mg Oral BID  . aspirin EC  81 mg Oral Daily  . dexamethasone  4 mg Intravenous 4 times per day  . digoxin  0.125 mg Intravenous Daily  . feeding supplement (RESOURCE BREEZE)  1 Container Oral BID BM  . pantoprazole (PROTONIX) IV  40 mg Intravenous QHS  . PARoxetine  20 mg Oral Daily  . piperacillin-tazobactam (ZOSYN)  IV  3.375 g Intravenous 3 times per day  . vancomycin  1,000 mg Intravenous Q12H   Continuous Infusions: . sodium chloride 75 mL/hr at 06/03/14 0514   PRN Meds:.metoprolol, nitroGLYCERIN  BP 98/68 mmHg  Pulse 115  Temp(Src) 97.5 F (36.4 C) (Oral)  Resp 22  Ht 5\' 8"  (1.727 m)  Wt 78.4 kg (172 lb 13.5 oz)  BMI 26.29 kg/m2  SpO2 93%   PPS:30 % at best   Intake/Output Summary (Last 24 hours) at 06/03/14 0931 Last data filed at 06/03/14 9150  Gross per 24 hour  Intake 3402.5 ml  Output    500 ml  Net 2902.5 ml     Physical Exam:  General: ill appearing, jaundice, generally uncomfortable appearing HEENT: large amount of thick blood tinged sputum calearsed from mouth, family noted right bottom tooth is "gone", patient reports "it fell out this morning" Chest:   decreased in bases CVS: tachycardic Abdomen: distended Ext: feet with + 2 edema Neuro: alert and oriented, easily agitated with detailed conversation  Labs: CBC    Component Value Date/Time   WBC 23.7* 06/03/2014 0418   RBC 5.31 06/03/2014 0418   HGB 16.3 06/03/2014 0418   HCT 47.0 06/03/2014 0418   PLT 78* 06/03/2014 0418   MCV 88.5 06/03/2014 0418   MCH 30.7 06/03/2014 0418   MCHC 34.7 06/03/2014 0418   RDW 21.5* 06/03/2014 0418   LYMPHSABS 0.9 05/30/2014 0818   MONOABS 1.6* 05/30/2014 0818   EOSABS 0.4 05/30/2014 0818   BASOSABS 0.0 05/30/2014 0818    BMET    Component Value Date/Time   NA 134* 06/03/2014 0418   K 4.9 06/03/2014 0418   CL 101 06/03/2014 0418   CO2 19 06/03/2014 0418   GLUCOSE 94 06/03/2014 0418    BUN 66* 06/03/2014 0418   CREATININE 1.07 06/03/2014 0418   CREATININE 0.97 12/23/2013 1807   CALCIUM 9.2 06/03/2014 0418   GFRNONAA 72* 06/03/2014 0418   GFRNONAA 83 12/23/2013 1807   GFRAA 84* 06/03/2014 0418   GFRAA >89 12/23/2013 1807    CMP     Component Value Date/Time   NA 134* 06/03/2014 0418   K 4.9 06/03/2014 0418   CL 101 06/03/2014 0418   CO2 19 06/03/2014 0418   GLUCOSE 94 06/03/2014 0418   BUN 66* 06/03/2014 0418   CREATININE 1.07 06/03/2014 0418   CREATININE 0.97 12/23/2013 1807   CALCIUM 9.2 06/03/2014 0418   PROT 6.0 05/31/2014 0500   ALBUMIN 2.3* 05/31/2014 0500   AST 243* 05/31/2014 0500   ALT 102* 05/31/2014 0500   ALKPHOS 519* 05/31/2014 0500   BILITOT 10.6* 05/31/2014 0500   GFRNONAA 72* 06/03/2014 0418   GFRNONAA 83 12/23/2013 1807   GFRAA 84* 06/03/2014 0418   GFRAA >89 12/23/2013 1807     Time In Time Out Total Time Spent with Patient Total Overall Time  0800 1030 100 min 30 min    Greater than 50%  of this time was spent counseling and coordinating care related to the above assessment and plan.   Wadie Lessen NP  Palliative Medicine Team Team Phone # [phone] Pager [phone]  Discussed with Dr Terrence Dupont, Dr Paulita Fujita

## 2014-06-03 NOTE — Progress Notes (Signed)
Still did not have Palliative care consult ordered to discuss goals of care with patient and family that has been requested for.  I called oncology and spoke with Aldona Bar who was able to come talk to the family about radiation.  They have refused radiaton for today and will be talking with Stanton Kidney with palliative at 8am tomorrow morning.  The wife is very confused as to what to do and the patient is very angry and continues to state that "I want to go home.".  Dr. Terrence Dupont was notified this morning.

## 2014-06-03 NOTE — Progress Notes (Signed)
Patient's procedure today (EGD with possible botox to GE junction) was cancelled.  He has been taking Eliquis bid, last dose last night at 10 pm.  I counseled patient and family that it is not safe to do EGD with submucosal injection of botox today under these circumstances.  Furthermore, patient is having progressive decrease in his platelet counts.  I have discussed option of doing the procedure, but Eliquis would have to be held for > 48 hours (so, Thursday at the earliest) before being able to do the procedure, and that is under the assumption that patient's platelet counts don't continue to drop.  Patient's family's current inclination is not to have the procedure done.  I have discussed with Palliative Medicine, and they will call me back if the family changes their mind and chooses to have the procedure done.

## 2014-06-03 NOTE — Consult Note (Signed)
[place]  [phone] [facility] CONSULTATION NOTE  MCCLELLAN DEMARAIS                                MR#: [id number]  DOB: 09-24-1950                       CSN#: [id number]  Referring MD: Dr. Shel[facility]  Reason for Consult: Lung Cancer   [id number] R Dieujuste is a 64 y.o. 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 He had dysphagia due to achalasia, for which he is to receive Botox today. He denies worsening shortness of breath, cough or chest pain. He denies any diarrhea or constipation. No confusion is reported by family. He denies any vision changes or double vision. He denies any headaches. Patient has been a 50 pack year smoker. A chest x ray showed bilateral infiltrates for which he was started on IV antibiotics. He was seen by Dr. Jana Hakim on 2/20 and discussions regarding palliative treatment options had been initiated. He was to start radiation to brain on 2/22 to the brain  and follow up as outpatient with Dr. Julien Nordmann.However, his overall clinical status is declining. Due to this current admission, we were notified of the pattient's admission.        PMH:  Past Medical History  Diagnosis Date   Hypertension 2012   Myocardial infarction 2003   Hyperlipidemia 2012   Achalasia of esophagus    Tobacco abuse    CAD (coronary artery disease)    Weight loss 05/2014    Approx 50 lbs   GERD (gastroesophageal reflux disease)    Metastatic cancer     Surgeries:  Past Surgical History  Procedure Laterality Date   Coronary artery bypass graft  2003, 2015    Left heart catheterization with coronary angiogram N/A 02/28/2012    Procedure: LEFT HEART CATHETERIZATION WITH CORONARY  ANGIOGRAM;  Surgeon: Clent Demark, MD;  Location: [facility] CATH LAB;  Service: Cardiovascular;  Laterality: N/A;   Percutaneous coronary stent intervention (pci-s) N/A 06/05/2012    Procedure: PERCUTANEOUS CORONARY STENT INTERVENTION (PCI-S);  Surgeon: Clent Demark, MD;  Location: [facility] CATH LAB;  Service: Cardiovascular;  Laterality: N/A;   Esophagogastroduodenoscopy N/A 05/21/2014    Procedure: ESOPHAGOGASTRODUODENOSCOPY (EGD);  Surgeon: Wonda Horner, MD;  Location[facility] ENDOSCOPY;  Service: Endoscopy;  Laterality: N/A;   Video bronchoscopy N/A 05/22/2014    Procedure: VIDEO BRONCHOSCOPY WITH FLUORO;  Surgeon: Wilhelmina Mcardle, MD;  Location: [facility] ENDOSCOPY;  Service: Cardiopulmonary;  Laterality: N/A;    Allergies:  Allergies  Allergen Reactions   Isosorbide Swelling    Swelling of left side of face    Medications:  Scheduled Meds:  ALPRAZolam  0.5 mg Oral BID   amiodarone  200 mg Oral Daily   apixaban  5 mg Oral BID   aspirin EC  81 mg Oral Daily   dexamethasone  4 mg Intravenous 4 times per day   digoxin  0.125 mg Intravenous Daily   feeding supplement (RESOURCE BREEZE)  1 Container Oral BID BM   pantoprazole (PROTONIX) IV  40 mg Intravenous QHS  PARoxetine  20 mg Oral Daily   piperacillin-tazobactam (ZOSYN)  IV  3.375 g Intravenous 3 times per day   vancomycin  1,000 mg Intravenous Q12H   Continuous Infusions:  sodium chloride 75 mL/hr at 06/03/14 0514   PRN Meds:.metoprolol, nitroGLYCERIN    Family History:    Family History  Problem Relation Age of Onset   Diabetes Mother    Heart disease Mother    Heart disease Father    Diabetes Father    Diabetes Sister     4 sisters    Diabetes Brother    Cancer Neg Hx    History   Social History   Marital Status: Married    Spouse Name: N/A   Number of Children: 2    Years of Education: 9   Occupational History   Disability     Social History Main Topics   Smoking status: Current Every Day  Smoker   Smokeless tobacco: Never Used   Alcohol Use: No   Drug Use: No   Sexual Activity: No   Other Topics Concern   Not on file   Social History Narrative   Married.    No sex since 2013 due to wife's concern about heart attack   2 daughters one in [place], one in [place]    Physical Exam   ECOG PERFORMANCE STATUS: 4 Bedbound (Completely disabled. Cannot carry on any self-care. Totally confined to bed or chair)    Filed Vitals:   06/03/14 0523  BP: 98/68  Pulse: 115  Temp: 97.5 F (36.4 C)  Resp: 22   Filed Weights   05/30/14 1126  Weight: 172 lb 13.5 oz (78.4 kg)    GENERAL:alert,ill appearing, uncomfortable.  SKIN: skin color jaundiced, texture, turgor are normal, no rashes or significant lesions EYES: normal, conjunctiva are pink and non-injected, sclera clear OROPHARYNX:no exudate, no erythema and lips, buccal mucosa, and tongue normal  NECK: supple, thyroid normal size, non-tender, without nodularity LYMPH:  no palpable lymphadenopathy in the cervical, axillary or inguinal LUNGS: diffuse rhonchi, wheezing.  HEART: regular rate & rhythm and no murmurs and no lower extremity edema ABDOMEN:abdomen soft, non-tender and normal bowel sounds Musculoskeletal:no cyanosis of digits and no clubbing  PSYCH: alert & oriented x 3 with fluent speech NEURO: no focal motor/sensory deficits  CBC  Recent Labs Lab 05/30/14 0818 05/31/14 0500 06/03/14 0418  WBC 18.3* 19.6* 23.7*  HGB 16.3 16.1 16.3  HCT 46.9 46.2 47.0  PLT 103* 101* 78*  MCV 89.2 90.2 88.5  MCH 31.0 31.4 30.7  MCHC 34.8 34.8 34.7  RDW 19.3* 19.8* 21.5*  LYMPHSABS 0.9  --   --   MONOABS 1.6*  --   --   EOSABS 0.4  --   --   BASOSABS 0.0  --   --     Anemia panel:  No results for input(s): VITAMINB12, FOLATE, FERRITIN, TIBC, IRON, RETICCTPCT in the last 72 hours.  CMP    Recent Labs Lab 05/30/14 0818 05/31/14 0500 06/03/14 0418  NA 132* 131* 134*  K 4.3 4.6 4.9  CL  99 98 101  CO2 24 21 19   GLUCOSE 91 120* 94  BUN 47* 53* 66*  CREATININE 1.11 1.13 1.07  CALCIUM 9.2 9.1 9.2  AST 278* 243*  --   ALT 105* 102*  --   ALKPHOS 543* 519*  --   BILITOT 9.5* 10.6*  --         Component Value Date/Time  BILITOT 10.6* 05/31/2014 0500     No results for input(s): INR, PROTIME in the last 168 hours.  No results for input(s): DDIMER in the last 72 hours.  Imaging Studies:  Dg Chest 2 View  06/01/2014   CLINICAL DATA:  Cough, hypertension, coronary artery disease post MI, smoker, metastatic lung cancer  EXAM: CHEST  2 VIEW  COMPARISON:  05/19/2014 chest radiographs and CT chest  FINDINGS: Normal heart size post median sternotomy and CABG.  Mediastinal contours normal.  Significant LEFT lower lobe infiltrate, increased.  Mildly increased infiltrate in RIGHT lower lobe as well.  Nodular foci in both lungs compatible metastases.  Small LEFT pleural effusion.  No pneumothorax.  Bones appear demineralized.  Small lytic lesion at the proximal LEFT humerus.  IMPRESSION: Pulmonary metastases.  Increased BILATERAL lower lobe infiltrates greater on LEFT.   Electronically Signed   By: Lavonia Dana M.D.   On: 06/01/2014 11:30   Ct Angio Chest Pe W/cm &/or Wo Cm  05/19/2014   CLINICAL DATA:  Shortness of Breath  EXAM: CT ANGIOGRAPHY CHEST WITH CONTRAST  TECHNIQUE: Multidetector CT imaging of the chest was performed using the standard protocol during bolus administration of intravenous contrast. Multiplanar CT image reconstructions and MIPs were obtained to evaluate the vascular anatomy.  CONTRAST:  11mL OMNIPAQUE IOHEXOL 350 MG/ML SOLN  COMPARISON:  Chest radiograph May 19, 2014  FINDINGS: There is no demonstrable pulmonary embolus. There is no appreciable thoracic aortic aneurysm.  There are innumerable small pulmonary nodular lesions throughout the lungs consistent with metastatic foci. Nodular lesions range in size from as small as 2 mm to as large as 1.8 x 1.7 cm on  the right and in the left mid lung measuring 1.6 x 1.3 cm. There is consolidation throughout much of the left lower lobe.  There is an enlarged lymph node in the aortopulmonary window region measuring 2.0 x 1.6 cm. There is adenopathy to the left of the trachea measuring 2.8 by 1.5 cm. There is adenopathy anterior to the carina measuring 1.9 x 1.7 cm. There is sub- carinal adenopathy measuring 3.4 x 1.8 cm. There is probable adenopathy in the left hilum which is indistinguishable from the adjacent airspace consolidation. There may well be an endobronchial mass in the left lower lobe as well.  There is fluid in wall thickening throughout the esophagus from the level of the aortic arch to the gastroesophageal junction. Proximal to this area, there is distention esophagus with air.  Pericardium is not thickened. Patient is status post coronary artery bypass grafting.  In the visualized upper abdomen, mild ascites is appreciable. There is a nodular lesion in the posterior segment of the right lobe of the liver measuring 1.7 x 1.5 cm.  There are no blastic or lytic bone lesions. There is slight anterior wedging of 2 mid thoracic vertebral bodies. Thyroid appears unremarkable.  Review of the MIP images confirms the above findings.  IMPRESSION: No demonstrable pulmonary embolus.  Innumerable pulmonary nodular lesions throughout the lungs consistent with widespread metastatic disease. There also foci of adenopathy. There is left lower lobe consolidation. There may be an endobronchial lesion obstructing the left lower lobe bronchus.  Generalized esophageal dilatation with fluid extending from the level of the aortic arch to the gastroesophageal junction. This finding could indicate a distal esophageal mass. Direct visualization may be advisable given this appearance.  Small nodular lesion in the right lobe of liver, concerning for metastatic focus given other findings.   Electronically Signed  By: Lowella Grip M.D.    On: 05/19/2014 21:42   Mr Jeri Cos QI Contrast  05/21/2014   CLINICAL DATA:  Suspected lung cancer with metastatic disease. Staging. Shortness of breath but no current neurologic symptoms.  EXAM: MRI HEAD WITHOUT AND WITH CONTRAST  TECHNIQUE: Multiplanar, multiecho pulse sequences of the brain and surrounding structures were obtained without and with intravenous contrast.  CONTRAST:  29mL MULTIHANCE GADOBENATE DIMEGLUMINE 529 MG/ML IV SOLN  COMPARISON:  None.  FINDINGS: No acute stroke or hemorrhage. No hydrocephalus or extra-axial fluid. Generalized atrophy. T2 and FLAIR hyperintensities throughout the periventricular greater than subcortical white matter, likely chronic microvascular ischemic change. Flow voids are maintained. No foci of chronic hemorrhage. Partial empty sella. No osseous destructive lesions.  Post infusion, there are numerous enhancing lesions throughout the brain consistent with metastatic disease. As seen on postcontrast axial T1 series 12, they include:  - RIGHT cerebellum, image 5, 3 mm.  - RIGHT insula, image 10, 2 mm.  - LEFT occipital periventricular white matter, 2 mm, image 10.  - LEFT occipital cortex, 3 mm, image 10.  - RIGHT lentiform nucleus, 3 mm, image 11.  - LEFT medial occipital cortex, 2 mm, image 11.  - LEFT medial parietal subcortical white matter, 1 mm, image 14.  - more superior LEFT parietal subcortical white matter, 2 mm, image 15.  -RIGHT frontal subcortical white matter, 1 mm, image 16.  - more anterior RIGHT frontal subcortical white matter, 4 mm, image 16. New  - RIGHT posterior frontal subcortical white matter, 1 mm, image 18.  Given the miliary nature of these lesions, and their small size, it is possible that more lesions exists but new lesions listed above can be correlated in at least 1 other plane.  No definite sinus or mastoid disease.  IMPRESSION: At least Eleven suspected metastases, many of which are 3 mm or less. Given the chest and abdomen findings,  metastatic small cell lung cancer is suspected.  No significant mass effect or cerebral edema associated with these multiple lesions.  Generalized atrophy with mild small vessel disease.  These results will be called to the ordering clinician or representative by the Radiologist Assistant, and communication documented in the PACS or zVision Dashboard.   Electronically Signed   By: Rolla Flatten M.D.   On: 05/21/2014 20:56   Ct Abdomen Pelvis W Contrast  05/21/2014   CLINICAL DATA:  Abnormal CT a chest yesterday. Significant weight loss and metastatic disease.  EXAM: CT ABDOMEN AND PELVIS WITH CONTRAST  TECHNIQUE: Multidetector CT imaging of the abdomen and pelvis was performed using the standard protocol following bolus administration of intravenous contrast.  CONTRAST:  63mL OMNIPAQUE IOHEXOL 300 MG/ML  SOLN  COMPARISON:  CT chest 05/19/2014  FINDINGS: Lung bases demonstrate multiple pulmonary nodules throughout both lungs consistent with metastatic disease. There is consolidation in the left lung base which may represent postobstructive pneumonia from an occult central lesion or it could represent consolidated pneumonia. The esophagus is distended and filled with fluid. Superior air-fluid level. Distal esophageal wall is thickened. Esophageal mass is not excluded. This could also represent reflux disease. Enlarged lymph nodes in the mediastinum.  Multiple heterogeneous hypo enhancing liver lesions likely representing metastases. Mild diffuse upper abdominal ascites extending down along the paracolic gutters into the pelvis. The gallbladder, pancreas, spleen, adrenal glands, kidneys, and inferior vena cava appear normal. Calcification of the abdominal aorta without aneurysm. Scattered retroperitoneal lymph nodes are present measuring up to about 10 mm  diameter. These are nonspecific but in the setting of widespread metastasis could represent early metastatic lesions. Stomach is not abnormally distended. Small  bowel are decompressed, limiting evaluation of the wall. Contrast material flows through the colon suggesting no evidence of small bowel obstruction. Contrast material and stool demonstrated throughout the colon. No evidence of colonic obstruction. No free air in the abdomen.  Pelvis: Diverticulosis of the sigmoid colon without evidence of diverticulitis. Free fluid in the pelvis as previously indicated. Prostate gland is not enlarged. Bladder wall is not thickened. Appendix is not identified. Degenerative changes in the spine and hips. There are few day low-attenuation lesions demonstrated in the right hemipelvis which are nonspecific could represent early bone metastases.  IMPRESSION: Diffuse metastatic disease in the visualized lungs and liver. Probable metastatic lymphadenopathy in the mediastinum. Mild enlargement of retroperitoneal lymph nodes possibly representing early metastasis. Diffuse free fluid in the abdomen. Consolidation in the left lower lung may represent pneumonia or postobstructive change. Esophageal dilatation with distal esophageal wall thickening could represent neoplasm or reflux disease.   Electronically Signed   By: Lucienne Capers M.D.   On: 05/21/2014 20:12   Dg Chest Portable 1 View  05/19/2014   CLINICAL DATA:  Cough and shortness of breath for 3 weeks  EXAM: PORTABLE CHEST - 1 VIEW  COMPARISON:  None.  FINDINGS: There is underlying emphysema. There is airspace consolidation in the left lower lobe.  There is a 1.8 x 1.5 cm nodular lesion in the right mid lung.  Heart size is normal. Pulmonary vascularity reflects underlying emphysema. Patient is status post coronary artery bypass grafting. No adenopathy.  IMPRESSION: 1.8 x 1.5 cm nodular lesion right mid lung. Advise noncontrast enhanced chest CT to further evaluate.  Left lower lobe consolidation.  Underlying emphysema.   Electronically Signed   By: Lowella Grip M.D.   On: 05/19/2014 20:14   Dg C-arm Bronchoscopy  05/22/2014    CLINICAL DATA:    C-ARM BRONCHOSCOPY  Fluoroscopy was utilized by the requesting physician.  No radiographic  interpretation.    Patient: DVAUGHN, FICKLE Collected: 05/22/2014 Client: [facility] Accession: [id number] Received: 05/22/2014 Merton Border DOB: 02-Apr-1951 Age: 57 Gender: M Reported: 05/26/2014 [address] Patient Ph: [phone] MRN #: [id number] [place], [place] [address] Visit #: [id number] Chart #: Phone:  Fax: CC: REPORT OF SURGICAL PATHOLOGY FINAL DIAGNOSIS Diagnosis Bronchus, biopsy, left lower lobe - INVASIVE ADENOCARCINOMA, SEE COMMENT. Microscopic Comment The adenocarcinoma demonstrates the following immunophenotype: TTF-1 - strong diffuse expression. Cytokeratin 5/6 - negative expression. Cytokeratin 7 - strong diffuse expression. Cytokeratin 20 - strong diffuse expression. Cytokeratin 903 - diffuse mild to moderate expression. Napsin A- strong diffuse expression. S100- negative expression. PSA - negative expression. p63 - negative expression CDX-2 - negative expression. Overall the morphology and immunophenotype are that of invasive adenocarcinoma, primary to lung. The case was reviewed with Dr. Lyndon Code who concurs. There is tumor available for ancillary tumor testing. (CRR:ds/gt 05/26/14) Mali RUND DO Pathologist, Electronic Signature (Case signed 05/26/2014) Specimen Gross and Clinical Information Specimen(s) Obtained: Bronchus, biopsy, left lower lobe Specimen Clinical Information (cm) 1 of 2 FINAL for [place], CHELF ([id number])  Assessment/Plan: 64 y.o.   Stage 4 Adenocarcinoma of the lung, NSCLCa Metastatic to the liver, brain He was started on Dexamethasone on admission He was to start radiation to the brain on 2/22 but due to overall decline this has been placed on hold. Palliative chemotherapy options are on hold as well. May consider Palliative  care evaluation for comfort care.   Nausea, vomiting This is  likely due to achalasia, as well as central involvement with tumor. Patient had left AMA on prior admission, without dexamethasone, returning 10 days later with these symptoms. This resulted in severe malnutrition as well. Currently patient is on dexamethasone He is for esophageal botox injection today to try to improve his symptoms  Thrombocytopenia This is due to malignancy, dilution, infection  Monitor counts closely No transfusion is indicated at this time Transfuse 1 unit of platelets if count is less or equal than 10,000 or 20,000 if the patient is acutely bleeding     Leukocytosis This is due to Decadron  No intervention is indicated at this time Will continue to monitor   DNR  Other medical issues as per admitting team  Rondel Jumbo, PA-C 06/03/2014 9:34 AM  ADDENDUM: Hematology/Oncology Attending: The patient is seen and examined. I agree with the above note. This is a very pleasant 64 years old white male who was recently diagnosed with metastatic non-small cell lung cancer, adenocarcinoma with multiple brain metastasis and diffuse metastatic disease in the lungs and liver as well as mediastinal and retroperitoneal lymphadenopathy. The patient also has dysphagia secondary to achalasia. He refused whole brain irradiation. I had a lengthy discussion with the patient and his family today. The palliative care nurse practitioner was also in the room with the family. The patient his condition has declined rapidly over the last few days. He is not interested in any treatment and preferred to go home with hospice. I strongly agreed with the patient his wishes and had a lengthy discussion with the patient and his family about his current disease status and prognosis. I think in the absence of any treatment, his survival is probably less than 3 months and most likely only a few weeks. At the end of the liver with the patient and his family would like to proceed with palliative care and  hospice. I personally don't see any need for any further intervention for this patient other than palliative and comfort care. Thank you so much for allowing me to participate in the care of Mr. Monarrez, please call if you have any questions.

## 2014-06-03 NOTE — Progress Notes (Signed)
Subjective:  Patient denies any chest pain or shortness of breath eager  to go home.  Discussed with family and wanted comfort care only with hospice care.patient not the candidate for chemotherapy or  Botox injection.  Objective:  Vital Signs in the last 24 hours: Temp:  [97.5 F (36.4 C)] 97.5 F (36.4 C) (02/23 1350) Pulse Rate:  [74-115] 74 (02/23 1350) Resp:  [22] 22 (02/23 1350) BP: (98-136)/(68-79) 117/77 mmHg (02/23 1350) SpO2:  [91 %-93 %] 93 % (02/23 1350)  Intake/Output from previous day: 02/22 0701 - 02/23 0700 In: 3402.5 [I.V.:2702.5; IV Piggyback:700] Out: 500 [Urine:500] Intake/Output from this shift:    Physical Exam: Neck: no adenopathy, no carotid bruit, no JVD and supple, symmetrical, trachea midline Lungs: occasional bilateral rhonchi noted Heart: irregularly irregular rhythm, S1, S2 normal and soft systolic murmur noted Abdomen: soft, non-tender; bowel sounds normal; no masses,  no organomegaly Extremities: extremities normal, atraumatic, no cyanosis or edema  Lab Results:  Recent Labs  06/03/14 0418  WBC 23.7*  HGB 16.3  PLT 78*    Recent Labs  06/03/14 0418  NA 134*  K 4.9  CL 101  CO2 19  GLUCOSE 94  BUN 66*  CREATININE 1.07   No results for input(s): TROPONINI in the last 72 hours.  Invalid input(s): CK, MB Hepatic Function Panel No results for input(s): PROT, ALBUMIN, AST, ALT, ALKPHOS, BILITOT, BILIDIR, IBILI in the last 72 hours. No results for input(s): CHOL in the last 72 hours. No results for input(s): PROTIME in the last 72 hours.  Imaging: Imaging results have been reviewed and No results found.  Cardiac Studies:  Assessment/Plan:  Bilateral aspiration pneumonia. 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 fib with moderate ventricular response Elevated LFTs secondary to 1 Failure to thrive/protein calorie malnutrition CAD history of MI 3 status post CABG in the past status post PCI to protected  left main in the past Hypertension Prediabetic Dysphagia Achalasia of distal esophagus History of tobacco abuse approximately 50-pack-years Depression Marked leukocytosis  Plan Continue present management Possible discharge tomorrow with comfort care measures only as per patient's and family wishes.  LOS: 4 days    Lionell Matuszak N 06/03/2014, 3:08 PM

## 2014-06-03 NOTE — Progress Notes (Signed)
Request received from [facility] for Hospice and [place] [facility]) services at home once discharged. Mr Knoblock was seen briefly at bedside; lethargic, awoke to voice, denied pain, stated he was comfortable at time of visit, notably jaundiced, bilateral LE cool to touch with noted discoloration; pt currently on room air per chart review has been intermittently using O2@ 2LNC, experiences dysphagia and needing oral suction; has been taking sips/chips will swish & spit for taste/comfort.  No family in room and Mr Hatchel in agreement with Probation officer contacting his wife Cecille Rubin [phone]) to further discuss needs at home once discharged. Writer spoke with Nadeen Landau at number noted above; she indicated she plans to be at hospital in the morning and will meet with writer at that time. Cecille Rubin confirmed that she/family want to support 'Kenny' ; no further treatment or testing is planned and their goal is for comfort at home; she stated her children are coming in to town tonight and tomorrow; Cecille Rubin stated that the plan is for home tomorrow by [facility] transport.  *Please leave condom catheter in place for comfort at discharge *Please complete/sign GOLD DNR Form on shadow chart to be sent home with pt *Discharging physician please note: PMT NP recommendations for d/c comfort medications 06/03/14  *Prescription for community pharmacy needs to note: *Dispense Quantity = 30 ml * on Concentrated Liquid Morphine prescription  DME needs discussed- Ms Duchene stated her children will be at the home to receive needed equipment  -Penryn notified and will contact [facility] directly to request the following DME be delivered to the home, preferrably tomorrow morning: 1) Complete O2 pkg B pt using O2@2LNC  intermittently; oral suction set up requested and 2)Complete Pkg D: fully electric hospital bed with AP&P mattress, half upper and lower rails and overbed table  Requested contact be made with  pt's wife Cecille Rubin at [phone] to arrange delivery time  Please notify [facility] at [phone] when PTAR on unit and pt ready to discharge;  Please fax completed d/c summary to [facility] at [phone]  Please call with any questions.  Danton Sewer, RN MSN [place] Liaison [phone]

## 2014-06-03 NOTE — Care Management Note (Addendum)
    Page 1 of 1   06/04/2014     2:32:04 PM CARE MANAGEMENT NOTE 06/04/2014  Patient:  Erik Blake, Erik Blake   Account Number:  [id number]  Date Initiated:  06/03/2014  Documentation initiated by:  Dessa Phi  Subjective/Objective Assessment:   64 y/o m admitted w/Vomiting.BZ:XYDS Ca.DNR.     Action/Plan:   From home w/spouse.   Anticipated DC Date:  06/04/2014   Anticipated DC Plan:  [place]  CM consult      Choice offered to / List presented to:  C-3 Spouse        HH arranged  HH-1 RN      [place] agency  [facility] [place]   Status of service:  Completed, signed off Medicare Important Message given?   (If response is "NO", the following Medicare IM given date Convey will be blank) Date Medicare IM given:   Medicare IM given by:   Date Additional Medicare IM given:   Additional Medicare IM given by:    Discharge Disposition:  [place]  Per UR Regulation:  Reviewed for med. necessity/level of care/duration of stay  If discussed at [place] of Stay Meetings, dates discussed:   06/03/2014    Comments:  06/04/14 Dessa Phi RN BSN NCM [phone] [facility] accepted case-rep [facility] arranging dme.Ambulance transp-CSW managing.  06/03/13 Dessa Phi RN BSN NCM [phone] TC spouse Lori c#[phone], chose [facility], left vm w/Margie Liason, & called main office [phone], Vickie informed of services,& dme.d/c in am ambulance transp. Received referral for home hospice choice.Will await family to come in rm to offer choice.Per palliative-will need dme, & ambulance transp. CSW notified.anticipated d/c home in am.

## 2014-06-03 NOTE — Progress Notes (Signed)
Pam, RN returned this RN's call. She reports the patient is refusing radiation treatment today. Informed Nicole, RT on L2.

## 2014-06-04 ENCOUNTER — Encounter: Payer: Self-pay | Admitting: *Deleted

## 2014-06-04 ENCOUNTER — Ambulatory Visit: Admit: 2014-06-04 | Payer: Medicaid Other | Admitting: Radiation Oncology

## 2014-06-04 ENCOUNTER — Ambulatory Visit: Payer: Medicaid Other

## 2014-06-04 MED ORDER — BISACODYL 10 MG RE SUPP: 10.0000 mg | Freq: Every day | RECTAL | Status: AC | PRN

## 2014-06-04 MED ORDER — MORPHINE SULFATE (CONCENTRATE) 10 MG/0.5ML PO SOLN: 5.0000 mg | ORAL | Status: AC | PRN

## 2014-06-04 MED ORDER — SCOPOLAMINE 1 MG/3DAYS TD PT72: 1.0000 | MEDICATED_PATCH | TRANSDERMAL | Status: AC

## 2014-06-04 NOTE — Progress Notes (Signed)
Follow-up on plan to discharge home today with Hospice and [place] following once home. Met with pt and wife Erik Blake at bedside this morning; pt with eyes closed low moaning/humming; awakes to voice, stated his back hurt last PRN medication last evening; staff RN Catie aware and will medicate. Wife shared she has been in discussion with Clarene Critchley from [place] regarding current insurance- Medicaid is in process; assured Erik Blake that [employer] specialist will be following up with Clarene Critchley and that this would not be an issue with [facility] admission; discussed with wife pt will have prescription for Concentrated Liquid Morphine at discharge- at wife's request contacted [facility] this prescription will cost $30.99 out of pocket; wife voiced she is able to obtain this medication privately and pt currently has Xanax at home - she will keep receipt for reimbursement and is aware once pt is admitted to [facility] services comfort medications will be covered. Wife shared her two daughters and their families are at her home; she shared everyone is having a hard time coming to grips with this fast decline; emotional support offered; Erik Blake is aware [facility] SW will be involved and counseling/support is available to adults and children. Lori informed DME to be delivered between 9-12 and hopeful for early afternoon discharge by [facility] transportation. -Discussed with Catie RN assessing need for PRN dose of Morphine prior to transport and new condom catheter to be placed prior to d/c. Staff RN Catie and Erik Blake aware to notify [facility] at [phone] when PTAR on unit and pt ready to leave. Please call with any questions Danton Sewer, RN MSN, [place] [facility] Liaison [phone]

## 2014-06-04 NOTE — Progress Notes (Signed)
CSW consulted for transportation needs. Patient will need non-emergency ambulance transport home with hospice. CSW confirmed home address with patient's wife, Loriat bedside. PTAR called for transport. No other CSW needs identified - CSW signing off.   Raynaldo Opitz, [facility] Clinical Social Worker cell #: [phone]

## 2014-06-04 NOTE — Progress Notes (Signed)
I completed D/C teaching with wife and answered all questions. The pt was D/C and sent home in stable condition with PTAR.

## 2014-06-04 NOTE — [facility] Oncology Navigator Note (Unsigned)
Spoke with Erik Blake yesterday.  He was supposed to see Dr. Julien Nordmann at a first visit but was in the hospital.  It seems that the patient would not like treatment and wants to go home.  Erik Kidney, NP for palliative care was speaking with the family.  Dr. Julien Nordmann was there as well and the family had good conversation about patients wishes.  It was nice to meet this family and speaking to them about Erik Blake.

## 2014-06-04 NOTE — Discharge Summary (Signed)
NAMEKHALEB, BROZ NO.:  [id number]  MEDICAL RECORD NO.:  [id number]  LOCATION:  3888                         FACILITY:  [facility]  PHYSICIAN:  Allegra Lai. Terrence Dupont, M.D. DATE OF BIRTH:  [birth year]  DATE OF ADMISSION:  05/30/2014 DATE OF DISCHARGE:  06/04/2014                              DISCHARGE SUMMARY   ADMITTING DIAGNOSES: 1. Metastatic cancer of the lung with mets to the brain and liver. 2. Atrial fibrillation with rapid ventricular response. 3. Elevated LFTs secondary to 1. 4. Failure to thrive. 5. Severe protein-calorie malnutrition. 6. Coronary artery disease, history of myocardial infarction x3 in the     past status post coronary artery bypass graft in the past status     post percutaneous coronary intervention to protected left main in     the past. 7. Hypertension. 8. Prediabetic. 9. Achalasia of the distal esophagus. 10.History of tobacco abuse approximately 50 pack years. 11.Depression.  FINAL DIAGNOSES: 1. Metastatic cancer of the lung with mets to the brain and liver. 2. Atrial fibrillation with rapid ventricular response. 3. Elevated LFTs secondary to 1. 4. Failure to thrive. 5. Severe protein-calorie malnutrition. 6. Coronary artery disease, history of myocardial infarction x3 in the     past status post coronary artery bypass graft in the past status     post percutaneous coronary intervention to protected left main in     the past. 7. Hypertension. 8. Prediabetic. 9. Achalasia of the distal esophagus. 10.History of tobacco abuse approximately 50 pack years. 11.Depression. 12.Possible aspiration pneumonia.  DISCHARGE HOME MEDICATIONS: 1. Dulcolax 1 suppository rectally as needed for constipation. 2. Morphine concentrate 5 mg total by mouth every 2 hours as needed     for moderate-to-severe pain. 3. Transdermal scopolamine 1 patch to the skin every 3 days. 4. Xanax 0.5 mg twice daily.  DIET:  As tolerated.  CODE STATUS:  The  patient is DNR and comfort care only as per patient and family request.  DISPOSITION:  Patient will be discharged home by ambulance and will be followed by Palliative Care.  HISTORY AND HOSPITAL COURSE:  Mr. Ferrell is a 64 year old male with past medical history significant for metastatic adenocarcinoma of the lung status post bronchoscopy/biopsy, noted to have mets to the brain and liver, coronary artery disease, history of MI x3 in the past, status post CABG, status post PCI to protected left main in the past, hypertension, chronic atrial fibrillation, achalasia of distal esophagus, history of tobacco abuse 50 pack years, severe protein- calorie malnutrition, depression.  He came to the ER complaining of generalized weakness and inability to get out of the bed and poor appetite associated with nausea, vomiting for last 1 week.  The patient was in the hospital approximately 10 days ago and signed out against medical advice.  The patient states he is not able to eat anything and is getting progressively weak.  Denies any chest pain and palpitation. Denies shortness of breath.  The patient was noted to be in AFib with RVR and dehydrated in ED.  PHYSICAL EXAMINATION:  GENERAL:  He was awake. VITAL SIGNS:  Blood pressure was 107/73, pulse was 87, when seen in the ER, he  was afebrile. HEENT:  Conjunctivae were pink.  Sclerae icteric. NECK:  Supple.  No JVD.  No bruit. LUNGS:  Decreased breath sounds at bases.  CARDIOVASCULAR:  Irregularly irregular.  S1, S2 was soft. ABDOMEN:  Soft.  Bowel sounds are present, nontender. EXTREMITIES:  There was no clubbing, cyanosis, or edema.  LABORATORY DATA:  His sodium was 132, potassium 4.3, BUN 47, creatinine 1.11.  His LFTs, alk phos was 543, albumin was low 2.4, total bilirubin was 9.5.  Hemoglobin was 16.3, hematocrit 46.9, white count of 18.3.  BRIEF HOSPITAL COURSE:  The patient was admitted to telemetry unit, was started on IV, was  continued on his home medication and GI and Oncology consultation was obtained.  Patient initially was full code, but after discussion with family by Dr. Lurline Del, family decided for DNR and then subsequently decided for comfort care only, with Palliative Care consultation was obtained.  The patient requested he does not need any brain radiation and chemotherapy.  The patient was also seen by radiation oncologist and was scheduled for brain radiation, but patient declined for any treatments and wanted to go home.  Palliative Care consultation was obtained and arrangements have been made to transfer patient home by ambulance.     Allegra Lai. Terrence Dupont, M.D.     MNH/MEDQ  D:  06/04/2014  T:  06/04/2014  Job:  [id number]

## 2014-06-04 NOTE — Discharge Summary (Signed)
Discharge summary dictated on 06/04/2014 dictation number is [phone]

## 2014-06-05 ENCOUNTER — Ambulatory Visit: Payer: Medicaid Other

## 2014-06-06 ENCOUNTER — Ambulatory Visit: Payer: Medicaid Other

## 2014-06-09 ENCOUNTER — Ambulatory Visit: Payer: Medicaid Other

## 2014-06-10 ENCOUNTER — Ambulatory Visit: Payer: Medicaid Other

## 2014-06-10 DEATH — deceased

## 2014-06-11 ENCOUNTER — Encounter (HOSPITAL_COMMUNITY): Payer: Self-pay

## 2014-06-11 ENCOUNTER — Ambulatory Visit: Payer: Medicaid Other

## 2014-06-12 ENCOUNTER — Ambulatory Visit: Payer: Medicaid Other

## 2014-06-13 ENCOUNTER — Ambulatory Visit: Payer: Medicaid Other

## 2014-06-16 ENCOUNTER — Ambulatory Visit: Payer: Medicaid Other

## 2014-06-17 ENCOUNTER — Ambulatory Visit: Payer: Medicaid Other

## 2014-06-18 ENCOUNTER — Ambulatory Visit: Payer: Medicaid Other

## 2014-06-19 ENCOUNTER — Ambulatory Visit: Payer: Medicaid Other

## 2014-06-20 ENCOUNTER — Ambulatory Visit: Payer: Medicaid Other | Admitting: Radiation Oncology

## 2014-06-20 ENCOUNTER — Ambulatory Visit: Payer: Medicaid Other

## 2014-06-23 ENCOUNTER — Ambulatory Visit: Payer: Medicaid Other

## 2014-06-23 ENCOUNTER — Ambulatory Visit: Payer: Medicaid Other | Admitting: Radiation Oncology

## 2014-06-24 ENCOUNTER — Ambulatory Visit: Payer: Medicaid Other

## 2014-06-26 ENCOUNTER — Ambulatory Visit: Payer: Medicaid Other

## 2016-10-18 IMAGING — CT CT ANGIO CHEST
2 of 9 series · 18 of 46 positions shown · IV contrast (OMNI)
Comparison: Chest radiograph May 19, 2014

CLINICAL DATA: Shortness of Breath

EXAM:
CT ANGIOGRAPHY CHEST WITH CONTRAST
TECHNIQUE: Multidetector CT imaging of the chest was performed using the
standard protocol during bolus administration of intravenous
contrast. Multiplanar CT image reconstructions and MIPs were
obtained to evaluate the vascular anatomy.
CONTRAST:  100mL OMNIPAQUE IOHEXOL 350 MG/ML SOLN

[Series 5: thins · axial · 0.75mm/px · z∈[+1277,+1549]mm · 15 of 306 slices shown]
[im 17/306  lung]
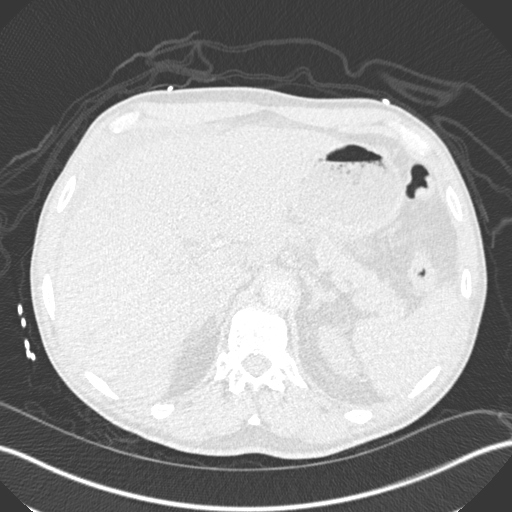
[im 34/306  soft-tissue]
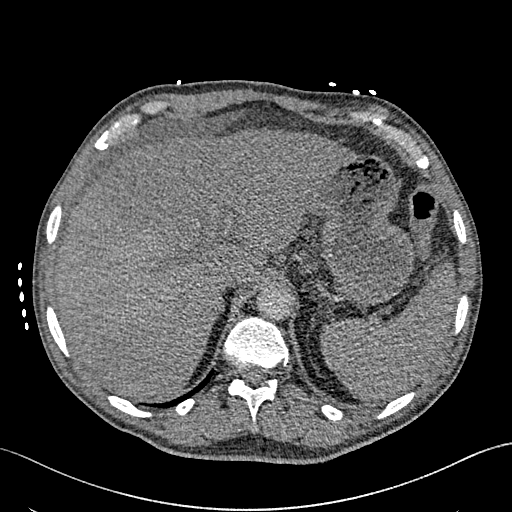
[im 51/306  lung]
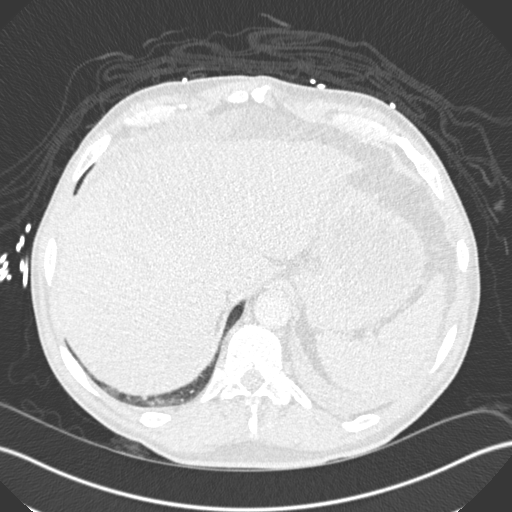
[im 68/306  soft-tissue]
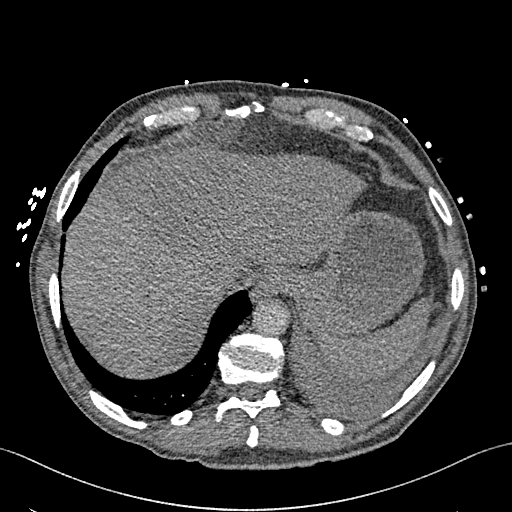
[im 102/306  lung]
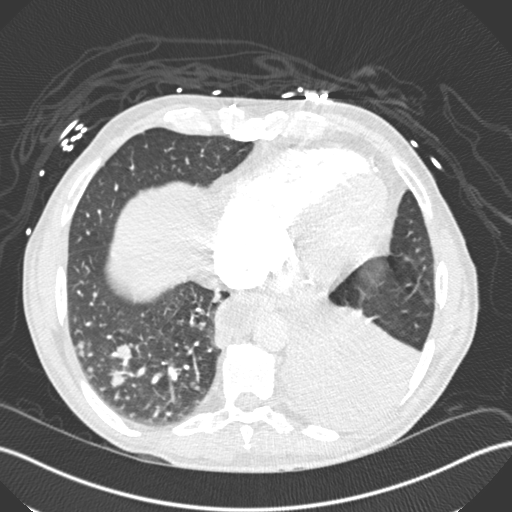
[im 119/306  soft-tissue]
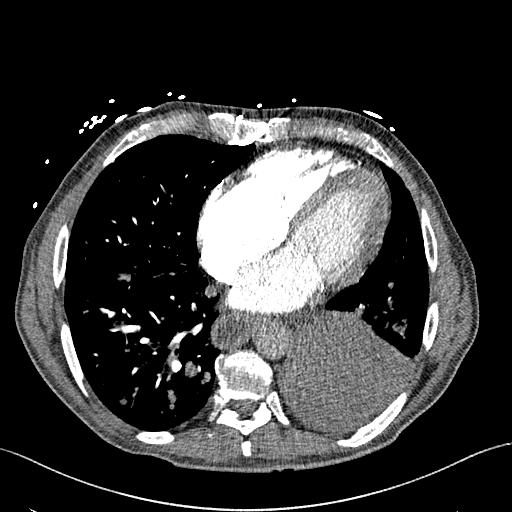
[im 136/306  lung]
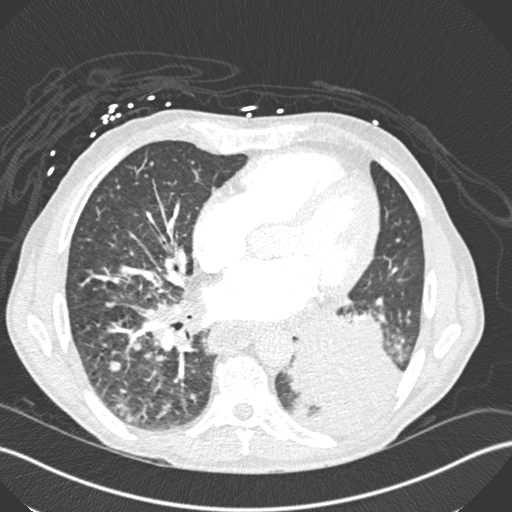
[im 153/306  soft-tissue]
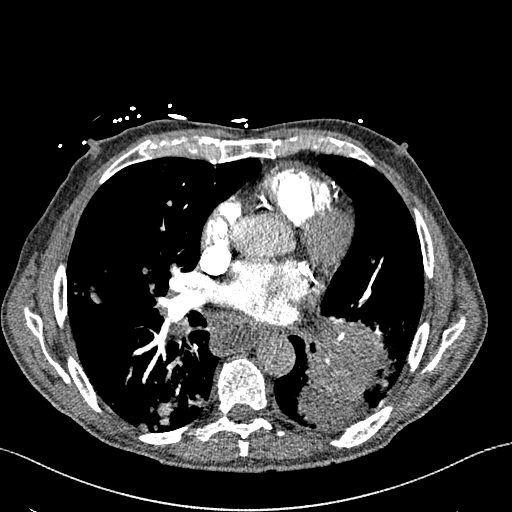
[im 170/306  lung]
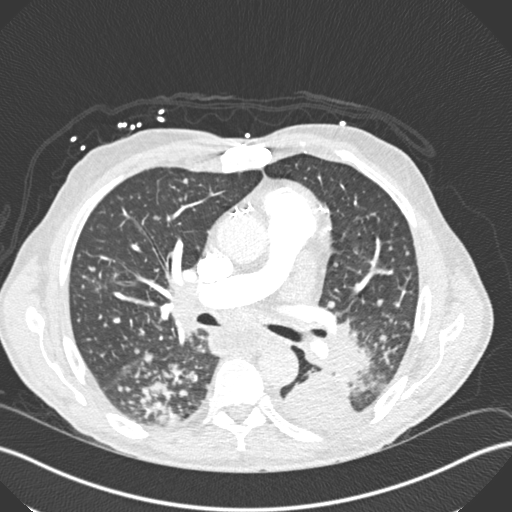
[im 187/306  soft-tissue]
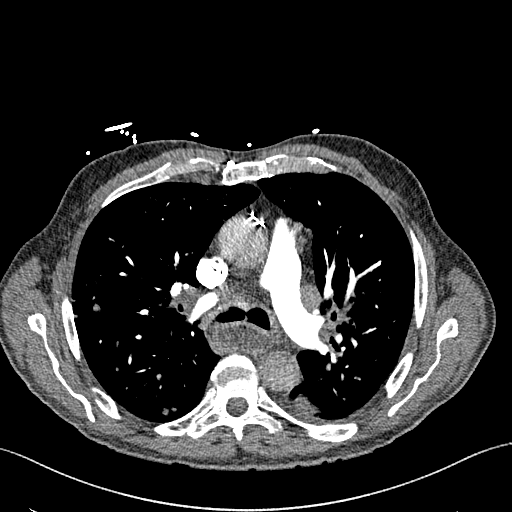
[im 204/306  lung]
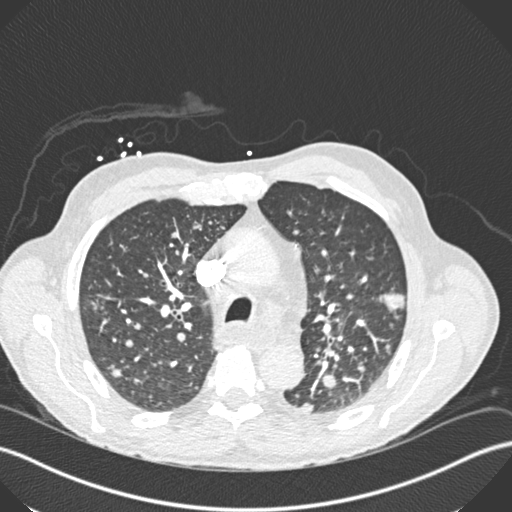
[im 238/306  soft-tissue]
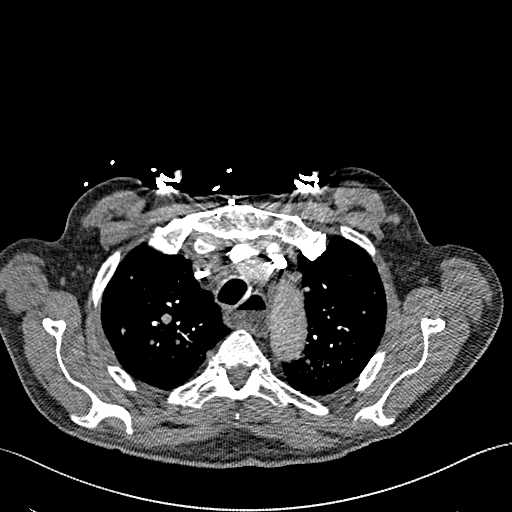
[im 255/306  lung]
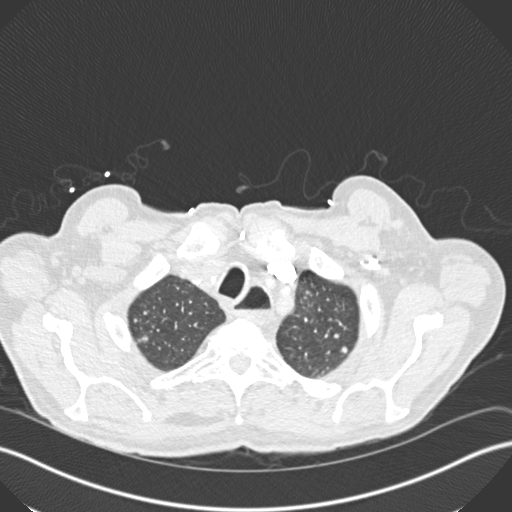
[im 272/306  soft-tissue]
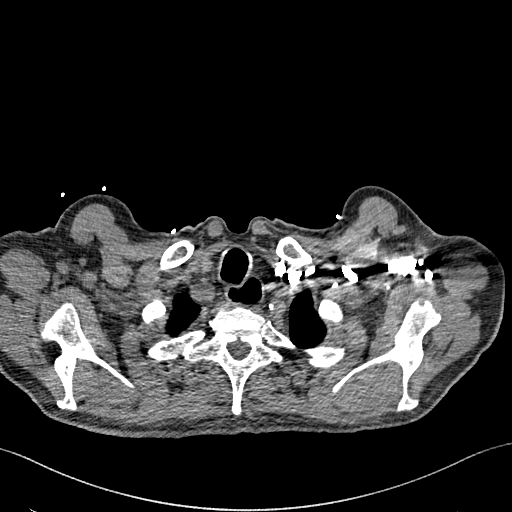
[im 289/306  lung]
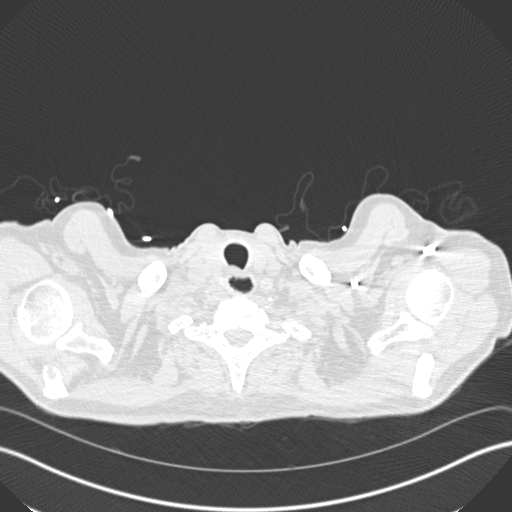

[Series 7: coronal mpr · coronal · 0.60mm/px · 3 of 143 slices shown]
[im 36/143  soft-tissue]
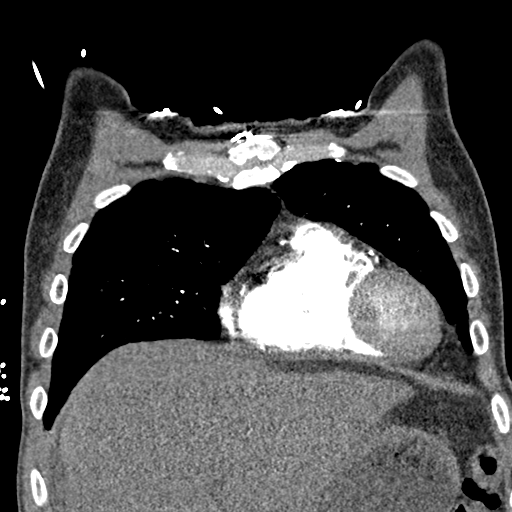
[im 72/143  soft-tissue]
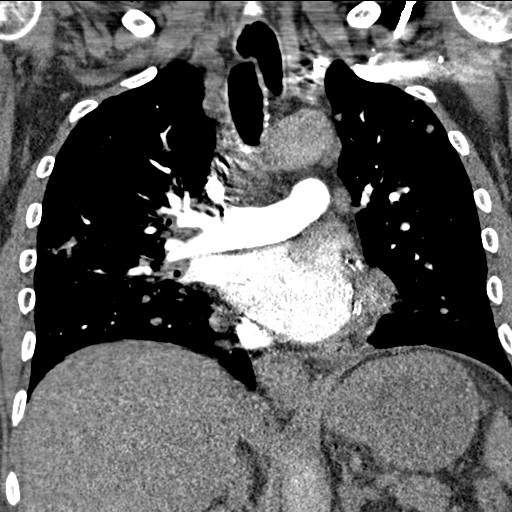
[im 107/143  soft-tissue]
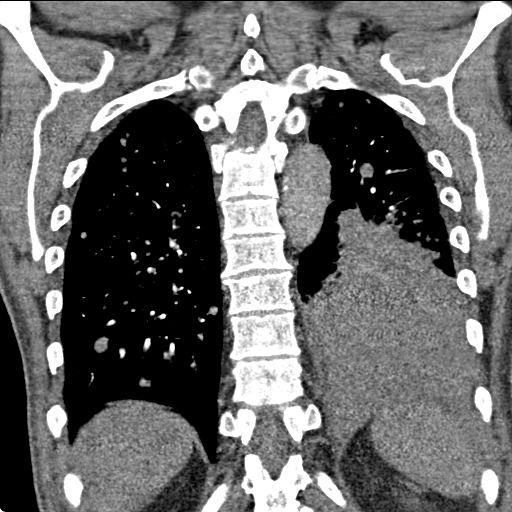

[18 of 46 positions shown; findings below may reference images not displayed]

FINDINGS: There is no demonstrable pulmonary embolus. There is no appreciable
thoracic aortic aneurysm.

There are innumerable small pulmonary nodular lesions throughout the
lungs consistent with metastatic foci. Nodular lesions range in size
from as small as 2 mm to as large as 1.8 x 1.7 cm on the right and
in the left mid lung measuring 1.6 x 1.3 cm. There is consolidation
throughout much of the left lower lobe.

There is an enlarged lymph node in the aortopulmonary window region
measuring 2.0 x 1.6 cm. There is adenopathy to the left of the
trachea measuring 2.8 by 1.5 cm. There is adenopathy anterior to the
carina measuring 1.9 x 1.7 cm. There is sub- carinal adenopathy
measuring 3.4 x 1.8 cm. There is probable adenopathy in the left
hilum which is indistinguishable from the adjacent airspace
consolidation. There may well be an endobronchial mass in the left
lower lobe as well.

There is fluid in wall thickening throughout the esophagus from the
level of the aortic arch to the gastroesophageal junction. Proximal
to this area, there is distention esophagus with air.

Pericardium is not thickened. Patient is status post coronary artery
bypass grafting.

In the visualized upper abdomen, mild ascites is appreciable. There
is a nodular lesion in the posterior segment of the right lobe of
the liver measuring 1.7 x 1.5 cm.

There are no blastic or lytic bone lesions. There is slight anterior
wedging of 2 mid thoracic vertebral bodies. Thyroid appears
unremarkable.

Review of the MIP images confirms the above findings.
IMPRESSION: No demonstrable pulmonary embolus.

Innumerable pulmonary nodular lesions throughout the lungs
consistent with widespread metastatic disease. There also foci of
adenopathy. There is left lower lobe consolidation. There may be an
endobronchial lesion obstructing the left lower lobe bronchus.

Generalized esophageal dilatation with fluid extending from the
level of the aortic arch to the gastroesophageal junction. This
finding could indicate a distal esophageal mass. Direct
visualization may be advisable given this appearance.

Small nodular lesion in the right lobe of liver, concerning for
metastatic focus given other findings.
